# Patient Record
Sex: Female | Born: 1950 | Race: White | Hispanic: No | Marital: Married | Smoking: Never smoker
Health system: Southern US, Community
[De-identification: ages and names within clinical notes are randomized; demographics above are authoritative.]

## PROBLEM LIST (undated history)

## (undated) DIAGNOSIS — I10 Essential (primary) hypertension: Secondary | ICD-10-CM

## (undated) DIAGNOSIS — F419 Anxiety disorder, unspecified: Secondary | ICD-10-CM

## (undated) HISTORY — PX: ABDOMINAL HYSTERECTOMY: SHX81

---

## 2001-05-29 ENCOUNTER — Ambulatory Visit (HOSPITAL_COMMUNITY): Admission: RE | Admit: 2001-05-29 | Discharge: 2001-05-29 | Payer: Self-pay | Admitting: Obstetrics and Gynecology

## 2001-05-29 ENCOUNTER — Encounter: Payer: Self-pay | Admitting: Obstetrics and Gynecology

## 2002-06-11 ENCOUNTER — Encounter: Payer: Self-pay | Admitting: Obstetrics and Gynecology

## 2002-06-11 ENCOUNTER — Ambulatory Visit (HOSPITAL_COMMUNITY): Admission: RE | Admit: 2002-06-11 | Discharge: 2002-06-11 | Payer: Self-pay | Admitting: Obstetrics and Gynecology

## 2002-06-15 ENCOUNTER — Encounter: Payer: Self-pay | Admitting: Obstetrics and Gynecology

## 2002-06-15 ENCOUNTER — Ambulatory Visit (HOSPITAL_COMMUNITY): Admission: RE | Admit: 2002-06-15 | Discharge: 2002-06-15 | Payer: Self-pay | Admitting: Obstetrics and Gynecology

## 2003-06-22 ENCOUNTER — Ambulatory Visit (HOSPITAL_COMMUNITY): Admission: RE | Admit: 2003-06-22 | Discharge: 2003-06-22 | Payer: Self-pay | Admitting: Obstetrics and Gynecology

## 2003-06-22 ENCOUNTER — Encounter: Payer: Self-pay | Admitting: Obstetrics and Gynecology

## 2003-06-28 ENCOUNTER — Ambulatory Visit (HOSPITAL_COMMUNITY): Admission: RE | Admit: 2003-06-28 | Discharge: 2003-06-28 | Payer: Self-pay | Admitting: Obstetrics and Gynecology

## 2003-06-28 ENCOUNTER — Encounter: Payer: Self-pay | Admitting: Obstetrics and Gynecology

## 2004-06-26 ENCOUNTER — Ambulatory Visit (HOSPITAL_COMMUNITY): Admission: RE | Admit: 2004-06-26 | Discharge: 2004-06-26 | Payer: Self-pay | Admitting: Obstetrics and Gynecology

## 2005-07-03 ENCOUNTER — Ambulatory Visit (HOSPITAL_COMMUNITY): Admission: RE | Admit: 2005-07-03 | Discharge: 2005-07-03 | Payer: Self-pay | Admitting: Obstetrics and Gynecology

## 2006-07-11 ENCOUNTER — Ambulatory Visit (HOSPITAL_COMMUNITY): Admission: RE | Admit: 2006-07-11 | Discharge: 2006-07-11 | Payer: Self-pay | Admitting: Internal Medicine

## 2007-07-17 ENCOUNTER — Ambulatory Visit (HOSPITAL_COMMUNITY): Admission: RE | Admit: 2007-07-17 | Discharge: 2007-07-17 | Payer: Self-pay | Admitting: Internal Medicine

## 2008-07-21 ENCOUNTER — Ambulatory Visit (HOSPITAL_COMMUNITY): Admission: RE | Admit: 2008-07-21 | Discharge: 2008-07-21 | Payer: Self-pay | Admitting: Internal Medicine

## 2008-10-31 ENCOUNTER — Emergency Department (HOSPITAL_COMMUNITY): Admission: EM | Admit: 2008-10-31 | Discharge: 2008-10-31 | Payer: Self-pay | Admitting: Family Medicine

## 2009-09-23 ENCOUNTER — Ambulatory Visit (HOSPITAL_COMMUNITY): Admission: RE | Admit: 2009-09-23 | Discharge: 2009-09-23 | Payer: Self-pay | Admitting: Internal Medicine

## 2010-05-22 ENCOUNTER — Ambulatory Visit: Payer: Self-pay | Admitting: Orthopedic Surgery

## 2010-05-22 DIAGNOSIS — Z8679 Personal history of other diseases of the circulatory system: Secondary | ICD-10-CM | POA: Insufficient documentation

## 2010-05-22 DIAGNOSIS — M25559 Pain in unspecified hip: Secondary | ICD-10-CM | POA: Insufficient documentation

## 2010-05-22 DIAGNOSIS — M549 Dorsalgia, unspecified: Secondary | ICD-10-CM | POA: Insufficient documentation

## 2010-07-10 ENCOUNTER — Ambulatory Visit: Payer: Self-pay | Admitting: Orthopedic Surgery

## 2010-07-10 DIAGNOSIS — G5600 Carpal tunnel syndrome, unspecified upper limb: Secondary | ICD-10-CM | POA: Insufficient documentation

## 2010-07-14 ENCOUNTER — Encounter: Payer: Self-pay | Admitting: Orthopedic Surgery

## 2010-08-07 ENCOUNTER — Ambulatory Visit: Payer: Self-pay | Admitting: Orthopedic Surgery

## 2010-08-14 ENCOUNTER — Telehealth (INDEPENDENT_AMBULATORY_CARE_PROVIDER_SITE_OTHER): Payer: Self-pay | Admitting: *Deleted

## 2010-08-14 ENCOUNTER — Telehealth: Payer: Self-pay | Admitting: Orthopedic Surgery

## 2010-08-21 ENCOUNTER — Telehealth: Payer: Self-pay | Admitting: Orthopedic Surgery

## 2010-11-07 NOTE — Assessment & Plan Note (Signed)
Summary: 4 WK RE-CHECK RT HAND/UMR/CAF   Visit Type:  Follow-up Referring :  self Primary :  Dr. Dwana Melena  CC:  right hand.  History of Present Illness: I saw Ellen Martinez in the office today for a 4 week  followup visit.  She is a 60 years old woman with the complaint of:  right hand  Meds: Estazalom, Diovan, Neurontin.  She states that her hand is better. She says that her back and hip is hurting again. She is not taking the Diclofenac since her last visit. It seemed to make her hand worse.  She has also changed jobs.  Rec trying a different Nsaid and continuing neurontin     Allergies: No Known Drug Allergies   Impression & Recommendations:  Problem # 1:  CARPAL TUNNEL SYNDROME (ICD-354.0) Assessment Improved  Orders: Est. Patient Level II (16109)  Problem # 2:  BACK PAIN (ICD-724.5) Assessment: Unchanged  Her updated medication list for this problem includes:    Diclofenac Sodium 75 Mg Tbec (Diclofenac sodium) .Marland Kitchen... 1 two times a day    Etodolac 300 Mg Caps (Etodolac) .Marland Kitchen... 1 by mouth two times a day  Orders: Est. Patient Level II (60454)  Medications Added to Medication List This Visit: 1)  Etodolac 300 Mg Caps (Etodolac) .Marland Kitchen.. 1 by mouth two times a day  Patient Instructions: 1)  Start new medication antiinflammatory Etodolac 2)  if this does not seem to help; callus and we'll try to change the medication by phone  Prescriptions: ETODOLAC 300 MG CAPS (ETODOLAC) 1 by mouth two times a day  #60 x 2   Entered and Authorized by:   Fuller Canada MD   Signed by:   Fuller Canada MD on 08/07/2010   Method used:   Print then Give to Patient   RxID:   [id number]    Orders Added: 1)  Est. Patient Level II [30865]

## 2010-11-07 NOTE — Assessment & Plan Note (Signed)
Summary: LEFT LEG PAIN NEEDS XR/UMR/BSF   Visit Type:  new patient  CC:  left leg pain.  History of Present Illness: I saw Ellen Martinez in the office today for an initial visit.  She is a 60 years old woman with the complaint of:  left leg pain x  3 mos  Xrays today.  Medications: Diovan, Estazalom. She reports no history of trauma but pain in her LEFT buttock region and groin and soreness of the LEFT leg and hip for about 3 months which is currently being relieved by diclofenac 75 mg.  The pain is described as dull intermittent and coming on suddenly.  His relieved by rest it is worse when she walks and while at work.  The pain intensity is 8/10.  She denies numbness tingling locking catching or swelling   Allergies (verified): No Known Drug Allergies  Past History:  Past Medical History: Anxiety Depression High blood pressure  Past Surgical History: Total Abdominal Hysterectomy  Review of Systems Constitutional:  Denies weight loss, weight gain, fever, chills, and fatigue. Cardiovascular:  Denies chest pain, palpitations, fainting, and murmurs. Respiratory:  Denies short of breath, wheezing, couch, tightness, pain on inspiration, and snoring . Gastrointestinal:  Denies heartburn, nausea, vomiting, diarrhea, constipation, and blood in your stools. Genitourinary:  Denies frequency, urgency, difficulty urinating, painful urination, flank pain, and bleeding in urine. Neurologic:  Denies numbness, tingling, unsteady gait, dizziness, tremors, and seizure. Musculoskeletal:  Complains of muscle pain; denies joint pain, swelling, instability, stiffness, redness, and heat. Endocrine:  Denies excessive thirst, exessive urination, and heat or cold intolerance. Psychiatric:  Complains of depression and anxiety; denies nervousness and hallucinations. Skin:  Denies changes in the skin, poor healing, rash, itching, and redness. HEENT:  Denies blurred or double vision, eye pain, redness,  and watering. Immunology:  Complains of seasonal allergies; denies sinus problems and allergic to bee stings. Hemoatologic:  Denies easy bleeding and brusing.  Physical Exam  Additional Exam:  GEN: well developed, well nourished, normal grooming and hygiene, no deformity and normal body habitus.   CDV: pulses are normal, no edema, no erythema. no tenderness  Lymph: normal lymph nodes   Skin: no rashes, skin lesions or open sores   NEURO: normal coordination, reflexes, sensation.   Psyche: awake, alert and oriented. Mood normal   Gait: normal   Lower Extremities leg lengths are equal.  No tenderness over the greater trochanter.  Range of motion is normal pain-free internal and external rotation as well as abduction and adduction.  Motor exam is normal.  Stability of the hip knees and ankles are normal.  No tenderness noted on examination     Impression & Recommendations:  Problem # 1:  BACK PAIN (ICD-724.5) Assessment New  Her updated medication list for this problem includes:    Diclofenac Sodium 75 Mg Tbec (Diclofenac sodium) .Marland Kitchen... 1 two times a day  Orders: New Patient Level III (16109) Lumbosacral Spine ,2/3 views (72100) anterior osteophytes are seen starting in one and 2 and progressing down the spine.  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 1 two times a day  Orders: New Patient Level III (60454) pelvic x-ray  Both hips are seen on the pelvis there is mild lateral tray space narrowing on the LEFT side not seen on the RIGHT suggesting hip arthritis.  Medications Added to Medication List This Visit: 1)  Diclofenac Sodium 75 Mg Tbec (Diclofenac sodium) .Marland Kitchen.. 1 two times a day  Other Orders: Hip x-ray unilateral complete, minimum 2 views (73510) Pelvis x-ray, 1/2 views  (24401)  Patient Instructions: 1)  1. back artritis and  2)  2. hip arthritis  3)  Continue diclofenac for 6 weeks  4)  Please schedule a follow-up appointment as needed. Prescriptions: DICLOFENAC SODIUM 75 MG TBEC (DICLOFENAC SODIUM) 1 two times a day  #60 x 1   Entered and Authorized by:   Fuller Canada MD   Signed by:   Fuller Canada MD on 05/22/2010   Method used:   Print then Give to Patient   RxID:   [id number]

## 2010-11-07 NOTE — Progress Notes (Signed)
Summary: patient states LT hip is still hurting  Phone Note Call from Patient   Caller: Patient Summary of Call: Patient called to relay, as advised at 08/07/10 appointment, that her LT hip is still hurting. States she started the medication Difloucenac 6 days ago, and she has not had much relief.  Asked if any other recommendation, "even an MRI"? Mentioned she is leaving for a trip 08/25/10.  Please advise.  (Her pharmacy is [facility] if any further medication.) Initial call taken by: Cammie Sickle,  August 14, 2010 2:07 PM  Follow-up for Phone Call        call her in a dose pack   prednisone 5mg   as directed  12 day pack x 1 Follow-up by: Fuller Canada MD,  August 14, 2010 4:39 PM

## 2010-11-07 NOTE — Progress Notes (Signed)
Summary: Rx question for hip pain  Phone Note Call from Patient   Caller: Patient Summary of Call: Patient called back to relay that she started the Prednisone X5 days ago as prescribed her LT hip, and has also tried with an Advil.  States still not noticing much difference.  Concerned as she is going away on Fri 08/25/10 for 9 days.  Asking if it would be advisable to try the Dicloflenac 75 mg, which she had been prescribed at initial visit for leg,hip.  Or come in? Please advise.  (Alternate Ph # [phone])  Initial call taken by: Cammie Sickle,  August 21, 2010 3:15 PM  Follow-up for Phone Call        okay to start diclofenac again  Follow-up by: Fuller Canada MD,  August 21, 2010 4:21 PM

## 2010-11-07 NOTE — Progress Notes (Signed)
Summary: Rx phoned in   Phone Note Outgoing Call   Summary of Call: I phoned in Rx per Dr Romeo Apple 08/14/10, to [facility] @ Ph [phone].  Patient has her prescriptions sent via courier to [facility]. Initial call taken by: Cammie Sickle,  August 14, 2010 6:29 PM  Follow-up for Phone Call        Followed up w/patient and w/[facility]. They are processing Rx and sending out on Wed (08/15/10) to [facility].  Patient notified. Follow-up by: Cammie Sickle,  August 15, 2010 11:51 AM

## 2010-11-07 NOTE — Letter (Signed)
Summary: History form  History form   Imported By: Jacklynn Ganong 07/14/2010 10:09:07  _____________________________________________________________________  External Attachment:    Type:   Image     Comment:   External Document

## 2010-11-07 NOTE — Assessment & Plan Note (Signed)
Summary: rt hand numbness/umr/bsf   Vital Signs:  Patient profile:   60 year old female Height:      62 inches Weight:      171 pounds Pulse rate:   60 / minute Resp:     16 per minute  Vitals Entered By: Fuller Canada MD (July 10, 2010 12:16 PM)  Visit Type:  new problem Referring Provider:  self Primary Provider:  Dr. Dwana Melena  CC:  hand pain.  History of Present Illness: I saw Ellen Martinez in the office today for a  visit.  She is a 60 years old woman with the complaint of:  pain in the right hand with burning.  No injury.  Meds: Estazalom, Diovan.  This patient complains of sharp burning intermittent severe pain in her RIGHT hand worse at night and worse with rest.  She did try some diclofenac but the symptoms seemed to get worse.  She was taking Naprosyn back pain issues she was having.  She's had symptoms for 4 months no injury.  She is already wearing a brace at night for the last 3 months without relief.  She has numbness and tingling in the median nerve distribution in her grip strength has weakened.    Allergies (verified): No Known Drug Allergies  Past History:  Past Medical History: Last updated: 05/22/2010 Anxiety Depression High blood pressure  Past Surgical History: Last updated: 05/22/2010 Total Abdominal Hysterectomy  Family History: Last updated: 07/10/2010 FH of Cancer:  Family History of Diabetes  Social History: Last updated: 07/10/2010 Patient is divorced.  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  Review of Systems Neurologic:  Complains of numbness and tingling; denies unsteady gait, dizziness, tremors, and seizure. Psychiatric:  Complains of depression and anxiety; denies nervousness and  hallucinations. Skin:  Complains of itching; denies changes in the skin, poor healing, rash, and redness. Hemoatologic:  Complains of brusing; denies easy bleeding.  The review of systems is negative for Constitutional, Cardiovascular, Respiratory, Gastrointestinal, Genitourinary, Musculoskeletal, Endocrine, HEENT, and Immunology.  Physical Exam  Additional Exam:  GEN: well developed, well nourished, normal grooming and hygiene, no deformity and normal body habitus.   CDV: pulses are normal, no edema, no erythema. no tenderness  Lymph: normal lymph nodes   Skin: no rashes, skin lesions or open sores   NEURO: normal coordination, reflexes, sensation.   Psyche: awake, alert and oriented. Mood normal    ambulation is noncontributory but normal  Her RIGHT hand looks normal she has some tenderness over the carpal tunnel a positive compression test and a positive Phalen's test.  The compression test was positive after about a minute and the Phalen's test was positive after about 40 seconds.  There is no swelling of the hand the range of motion is full her grip strength appears equal to her opposite side the wrist joint is stable  Tinel's sign is negative over the carpal tunnel there   Impression & Recommendations:  Problem # 1:  CARPAL TUNNEL SYNDROME (ICD-354.0) Assessment New  Orders: Est. Patient Level IV (16109)  Medications Added to Medication List This Visit: 1)  Neurontin 100 Mg Caps (Gabapentin) .Marland Kitchen.. 1 by mouth 1st night 2 by mouth 2nd night  3 by mouth 3rd  night on  Patient Instructions: 1)  Use brace every night for pain 2)  Start Over the counter B6 100mg  two times a day 3)  Start Neurontin, take one tablet 1st night before sleep, 2nd night 2 tablets before sleep, 3rd night 3 tablets before sleep and 3 tablets every night if tolerated well if not back off to 2 tablets at night 4)  Come back in 4 weeks Prescriptions: NEURONTIN 100 MG CAPS (GABAPENTIN) 1 by mouth 1st  night 2 by mouth 2nd night  3 by mouth 3rd night on  #90 x 1   Entered and Authorized by:   Fuller Canada MD   Signed by:   Fuller Canada MD on 07/10/2010   Method used:   Print then Give to Patient   RxID:   [id number]

## 2010-11-07 NOTE — Letter (Signed)
Summary: History form  History form   Imported By: Jacklynn Ganong 05/23/2010 15:37:24  _____________________________________________________________________  External Attachment:    Type:   Image     Comment:   External Document

## 2011-03-27 ENCOUNTER — Other Ambulatory Visit (HOSPITAL_COMMUNITY): Payer: Self-pay | Admitting: Internal Medicine

## 2011-03-27 DIAGNOSIS — Z139 Encounter for screening, unspecified: Secondary | ICD-10-CM

## 2011-04-02 ENCOUNTER — Ambulatory Visit (HOSPITAL_COMMUNITY)
Admission: RE | Admit: 2011-04-02 | Discharge: 2011-04-02 | Disposition: A | Discharge status: Home / Self Care | Payer: 59 | Source: Ambulatory Visit | Attending: Internal Medicine | Admitting: Internal Medicine

## 2011-04-02 DIAGNOSIS — Z139 Encounter for screening, unspecified: Secondary | ICD-10-CM

## 2011-04-02 DIAGNOSIS — Z1231 Encounter for screening mammogram for malignant neoplasm of breast: Secondary | ICD-10-CM | POA: Insufficient documentation

## 2011-05-07 ENCOUNTER — Other Ambulatory Visit: Payer: Self-pay | Admitting: Orthopedic Surgery

## 2011-05-07 NOTE — Telephone Encounter (Signed)
Refill prescription

## 2012-04-22 ENCOUNTER — Other Ambulatory Visit: Payer: Self-pay | Admitting: Orthopedic Surgery

## 2013-01-01 ENCOUNTER — Other Ambulatory Visit (HOSPITAL_COMMUNITY): Payer: Self-pay | Admitting: Internal Medicine

## 2013-01-01 ENCOUNTER — Ambulatory Visit (HOSPITAL_COMMUNITY)
Admission: RE | Admit: 2013-01-01 | Discharge: 2013-01-01 | Disposition: A | Discharge status: Home / Self Care | Payer: 59 | Source: Ambulatory Visit | Attending: Internal Medicine | Admitting: Internal Medicine

## 2013-01-01 DIAGNOSIS — Z139 Encounter for screening, unspecified: Secondary | ICD-10-CM

## 2013-01-01 DIAGNOSIS — Z1231 Encounter for screening mammogram for malignant neoplasm of breast: Secondary | ICD-10-CM | POA: Insufficient documentation

## 2013-01-21 ENCOUNTER — Telehealth: Payer: Self-pay | Admitting: *Deleted

## 2013-01-21 NOTE — Telephone Encounter (Signed)
Refill request: Diclofenac Sod 75 mg. [facility]

## 2013-01-23 ENCOUNTER — Other Ambulatory Visit: Payer: Self-pay | Admitting: Orthopedic Surgery

## 2013-01-23 MED ORDER — DICLOFENAC SODIUM 75 MG PO TBEC: 75.0000 mg | DELAYED_RELEASE_TABLET | Freq: Two times a day (BID) | ORAL | Status: DC

## 2013-01-23 NOTE — Telephone Encounter (Signed)
x

## 2015-11-01 DIAGNOSIS — L570 Actinic keratosis: Secondary | ICD-10-CM | POA: Diagnosis not present

## 2015-11-01 DIAGNOSIS — X32XXXA Exposure to sunlight, initial encounter: Secondary | ICD-10-CM | POA: Diagnosis not present

## 2015-11-01 DIAGNOSIS — B009 Herpesviral infection, unspecified: Secondary | ICD-10-CM | POA: Diagnosis not present

## 2015-11-21 DIAGNOSIS — H43813 Vitreous degeneration, bilateral: Secondary | ICD-10-CM | POA: Diagnosis not present

## 2015-11-29 DIAGNOSIS — Z23 Encounter for immunization: Secondary | ICD-10-CM | POA: Diagnosis not present

## 2015-11-29 DIAGNOSIS — J209 Acute bronchitis, unspecified: Secondary | ICD-10-CM | POA: Diagnosis not present

## 2016-04-16 DIAGNOSIS — X32XXXD Exposure to sunlight, subsequent encounter: Secondary | ICD-10-CM | POA: Diagnosis not present

## 2016-04-16 DIAGNOSIS — L570 Actinic keratosis: Secondary | ICD-10-CM | POA: Diagnosis not present

## 2016-04-17 DIAGNOSIS — E782 Mixed hyperlipidemia: Secondary | ICD-10-CM | POA: Diagnosis not present

## 2016-04-17 DIAGNOSIS — R7301 Impaired fasting glucose: Secondary | ICD-10-CM | POA: Diagnosis not present

## 2016-04-17 DIAGNOSIS — I10 Essential (primary) hypertension: Secondary | ICD-10-CM | POA: Diagnosis not present

## 2016-04-23 DIAGNOSIS — L570 Actinic keratosis: Secondary | ICD-10-CM | POA: Diagnosis not present

## 2016-04-23 DIAGNOSIS — E782 Mixed hyperlipidemia: Secondary | ICD-10-CM | POA: Diagnosis not present

## 2016-04-23 DIAGNOSIS — F5101 Primary insomnia: Secondary | ICD-10-CM | POA: Diagnosis not present

## 2016-04-23 DIAGNOSIS — R7301 Impaired fasting glucose: Secondary | ICD-10-CM | POA: Diagnosis not present

## 2016-04-23 DIAGNOSIS — I10 Essential (primary) hypertension: Secondary | ICD-10-CM | POA: Diagnosis not present

## 2016-05-29 DIAGNOSIS — X32XXXD Exposure to sunlight, subsequent encounter: Secondary | ICD-10-CM | POA: Diagnosis not present

## 2016-05-29 DIAGNOSIS — L281 Prurigo nodularis: Secondary | ICD-10-CM | POA: Diagnosis not present

## 2016-05-29 DIAGNOSIS — L570 Actinic keratosis: Secondary | ICD-10-CM | POA: Diagnosis not present

## 2017-03-20 DIAGNOSIS — X32XXXD Exposure to sunlight, subsequent encounter: Secondary | ICD-10-CM | POA: Diagnosis not present

## 2017-03-20 DIAGNOSIS — L570 Actinic keratosis: Secondary | ICD-10-CM | POA: Diagnosis not present

## 2017-03-20 DIAGNOSIS — L820 Inflamed seborrheic keratosis: Secondary | ICD-10-CM | POA: Diagnosis not present

## 2017-05-06 ENCOUNTER — Other Ambulatory Visit (HOSPITAL_COMMUNITY): Payer: Self-pay | Admitting: Internal Medicine

## 2017-05-06 DIAGNOSIS — Z1231 Encounter for screening mammogram for malignant neoplasm of breast: Secondary | ICD-10-CM

## 2017-05-08 ENCOUNTER — Ambulatory Visit (HOSPITAL_COMMUNITY)
Admission: RE | Admit: 2017-05-08 | Discharge: 2017-05-08 | Disposition: A | Discharge status: Home / Self Care | Payer: Medicare Other | Source: Ambulatory Visit | Attending: Internal Medicine | Admitting: Internal Medicine

## 2017-05-08 DIAGNOSIS — Z1231 Encounter for screening mammogram for malignant neoplasm of breast: Secondary | ICD-10-CM | POA: Insufficient documentation

## 2017-06-04 DIAGNOSIS — I10 Essential (primary) hypertension: Secondary | ICD-10-CM | POA: Diagnosis not present

## 2017-06-04 DIAGNOSIS — R7301 Impaired fasting glucose: Secondary | ICD-10-CM | POA: Diagnosis not present

## 2017-06-06 DIAGNOSIS — R7301 Impaired fasting glucose: Secondary | ICD-10-CM | POA: Diagnosis not present

## 2017-06-06 DIAGNOSIS — Z6831 Body mass index (BMI) 31.0-31.9, adult: Secondary | ICD-10-CM | POA: Diagnosis not present

## 2017-06-06 DIAGNOSIS — I10 Essential (primary) hypertension: Secondary | ICD-10-CM | POA: Diagnosis not present

## 2017-06-06 DIAGNOSIS — F5101 Primary insomnia: Secondary | ICD-10-CM | POA: Diagnosis not present

## 2017-06-06 DIAGNOSIS — E782 Mixed hyperlipidemia: Secondary | ICD-10-CM | POA: Diagnosis not present

## 2017-06-06 DIAGNOSIS — L570 Actinic keratosis: Secondary | ICD-10-CM | POA: Diagnosis not present

## 2017-07-16 DIAGNOSIS — X32XXXD Exposure to sunlight, subsequent encounter: Secondary | ICD-10-CM | POA: Diagnosis not present

## 2017-07-16 DIAGNOSIS — L570 Actinic keratosis: Secondary | ICD-10-CM | POA: Diagnosis not present

## 2017-12-10 DIAGNOSIS — X32XXXD Exposure to sunlight, subsequent encounter: Secondary | ICD-10-CM | POA: Diagnosis not present

## 2017-12-10 DIAGNOSIS — C44311 Basal cell carcinoma of skin of nose: Secondary | ICD-10-CM | POA: Diagnosis not present

## 2017-12-10 DIAGNOSIS — L570 Actinic keratosis: Secondary | ICD-10-CM | POA: Diagnosis not present

## 2018-01-20 DIAGNOSIS — I10 Essential (primary) hypertension: Secondary | ICD-10-CM | POA: Diagnosis not present

## 2018-01-20 DIAGNOSIS — F5101 Primary insomnia: Secondary | ICD-10-CM | POA: Diagnosis not present

## 2018-01-20 DIAGNOSIS — Z6829 Body mass index (BMI) 29.0-29.9, adult: Secondary | ICD-10-CM | POA: Diagnosis not present

## 2018-02-13 DIAGNOSIS — I10 Essential (primary) hypertension: Secondary | ICD-10-CM | POA: Diagnosis not present

## 2018-02-13 DIAGNOSIS — M79672 Pain in left foot: Secondary | ICD-10-CM | POA: Diagnosis not present

## 2018-02-13 DIAGNOSIS — F5101 Primary insomnia: Secondary | ICD-10-CM | POA: Diagnosis not present

## 2018-02-13 DIAGNOSIS — Z6829 Body mass index (BMI) 29.0-29.9, adult: Secondary | ICD-10-CM | POA: Diagnosis not present

## 2018-05-06 ENCOUNTER — Other Ambulatory Visit (HOSPITAL_COMMUNITY): Payer: Self-pay | Admitting: Internal Medicine

## 2018-05-06 DIAGNOSIS — Z1231 Encounter for screening mammogram for malignant neoplasm of breast: Secondary | ICD-10-CM

## 2018-05-14 ENCOUNTER — Ambulatory Visit (HOSPITAL_COMMUNITY)
Admission: RE | Admit: 2018-05-14 | Discharge: 2018-05-14 | Disposition: A | Discharge status: Home / Self Care | Payer: Medicare Other | Source: Ambulatory Visit | Attending: Internal Medicine | Admitting: Internal Medicine

## 2018-05-14 DIAGNOSIS — Z1231 Encounter for screening mammogram for malignant neoplasm of breast: Secondary | ICD-10-CM | POA: Insufficient documentation

## 2018-05-22 DIAGNOSIS — I10 Essential (primary) hypertension: Secondary | ICD-10-CM | POA: Diagnosis not present

## 2018-05-22 DIAGNOSIS — R7301 Impaired fasting glucose: Secondary | ICD-10-CM | POA: Diagnosis not present

## 2018-05-26 DIAGNOSIS — Z0000 Encounter for general adult medical examination without abnormal findings: Secondary | ICD-10-CM | POA: Diagnosis not present

## 2018-05-26 DIAGNOSIS — E782 Mixed hyperlipidemia: Secondary | ICD-10-CM | POA: Diagnosis not present

## 2018-05-26 DIAGNOSIS — G4700 Insomnia, unspecified: Secondary | ICD-10-CM | POA: Diagnosis not present

## 2018-05-26 DIAGNOSIS — I10 Essential (primary) hypertension: Secondary | ICD-10-CM | POA: Diagnosis not present

## 2018-05-26 DIAGNOSIS — H6120 Impacted cerumen, unspecified ear: Secondary | ICD-10-CM | POA: Diagnosis not present

## 2018-05-26 DIAGNOSIS — Z6829 Body mass index (BMI) 29.0-29.9, adult: Secondary | ICD-10-CM | POA: Diagnosis not present

## 2018-05-26 DIAGNOSIS — R7301 Impaired fasting glucose: Secondary | ICD-10-CM | POA: Diagnosis not present

## 2018-10-10 DIAGNOSIS — M7672 Peroneal tendinitis, left leg: Secondary | ICD-10-CM | POA: Diagnosis not present

## 2018-10-10 DIAGNOSIS — M79672 Pain in left foot: Secondary | ICD-10-CM | POA: Diagnosis not present

## 2018-10-10 DIAGNOSIS — M7662 Achilles tendinitis, left leg: Secondary | ICD-10-CM | POA: Diagnosis not present

## 2019-03-01 IMAGING — MG DIGITAL SCREENING BILATERAL MAMMOGRAM WITH TOMO AND CAD
5 series · 6 of 13 positions shown · non-contrast
Comparison: Previous exam(s).

CLINICAL DATA: Screening.

EXAM:
DIGITAL SCREENING BILATERAL MAMMOGRAM WITH TOMO AND CAD

[R CC synth-2D]
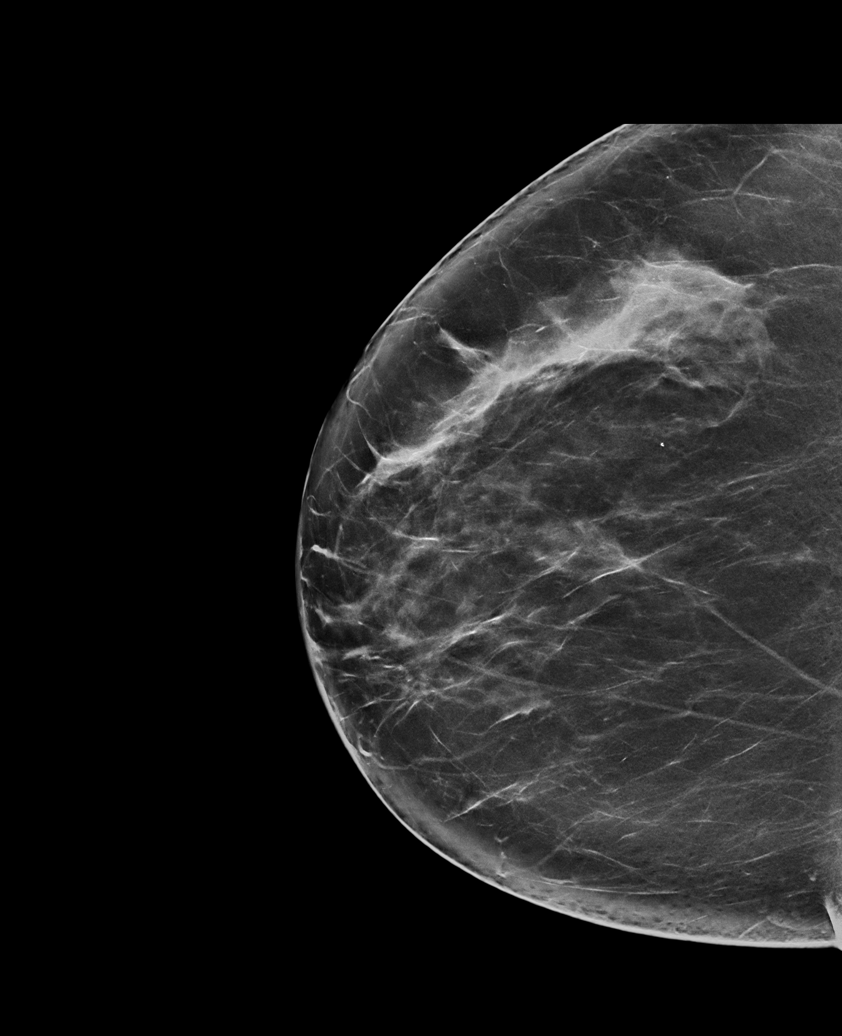

[L MLO synth-2D]
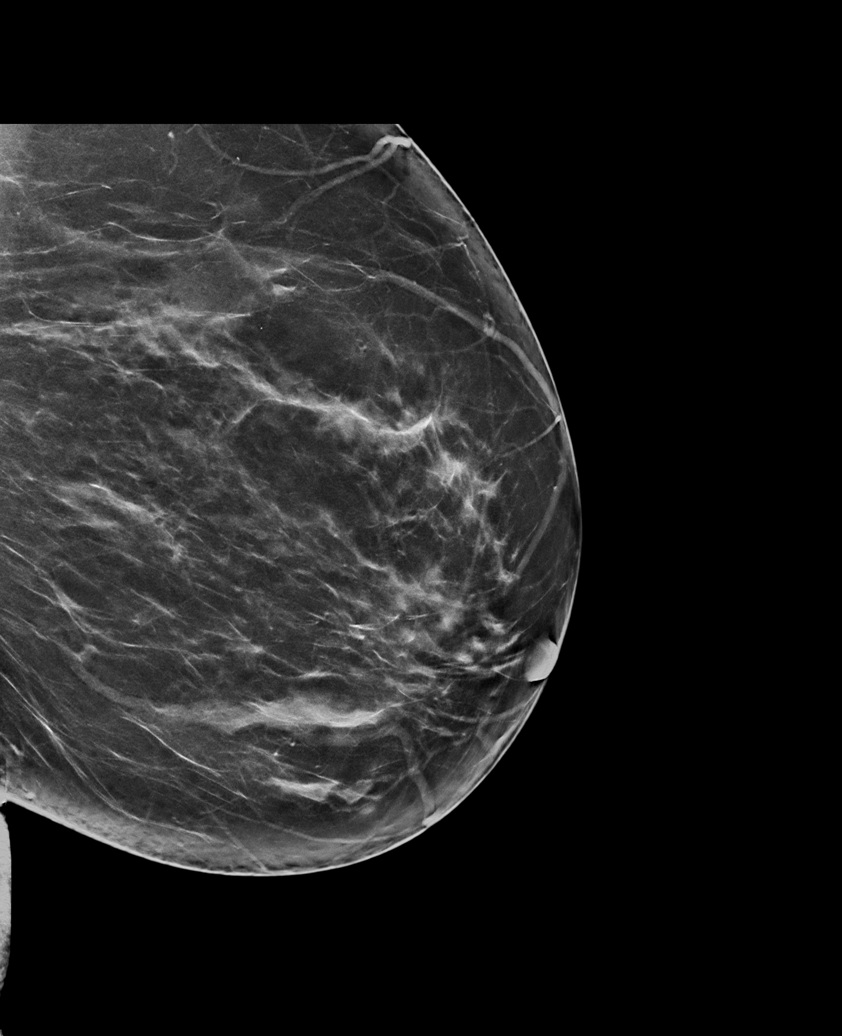

[L CC synth-2D]
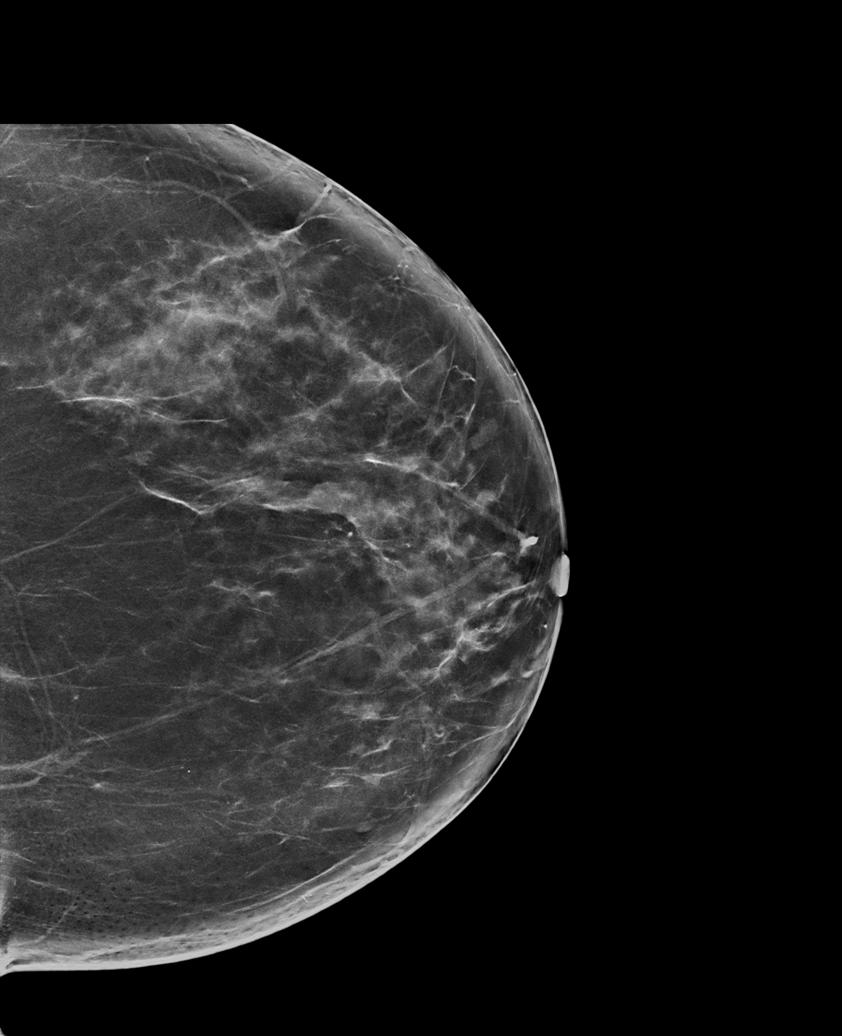

[R CC tomo · 2 of 85 frames shown]
[frame 28/85]
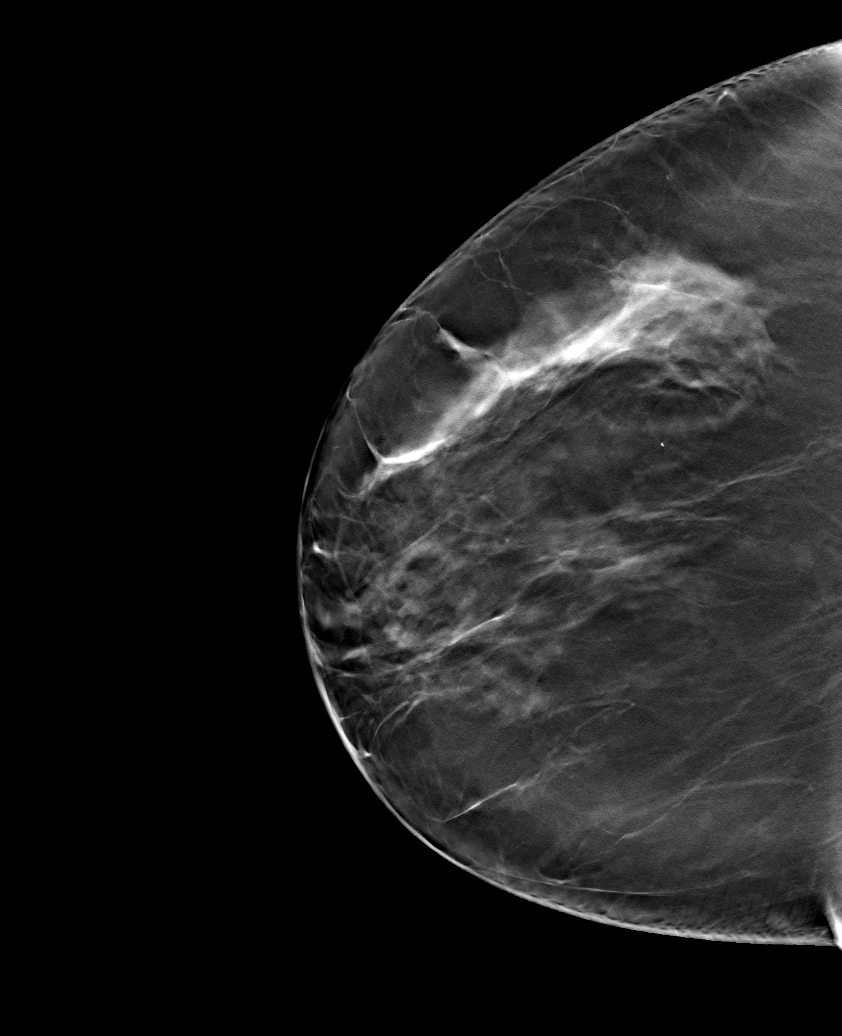
[frame 43/85]
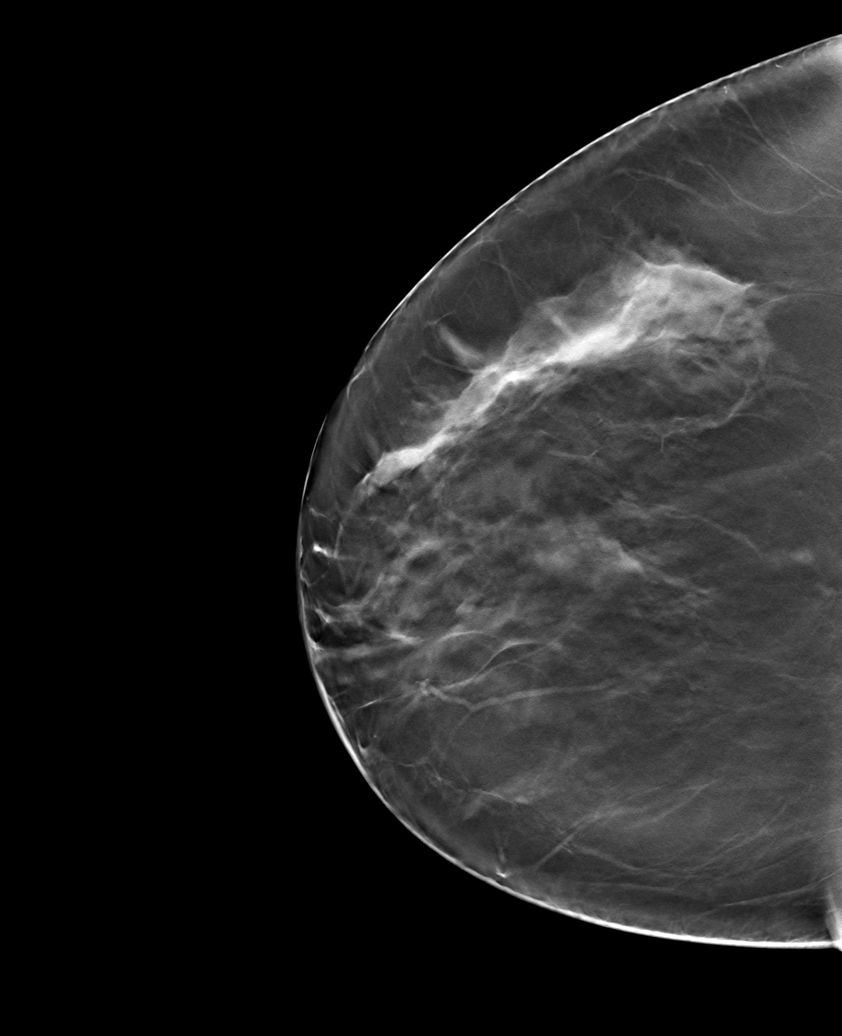

[L MLO tomo · tomo slice 43/84.0]
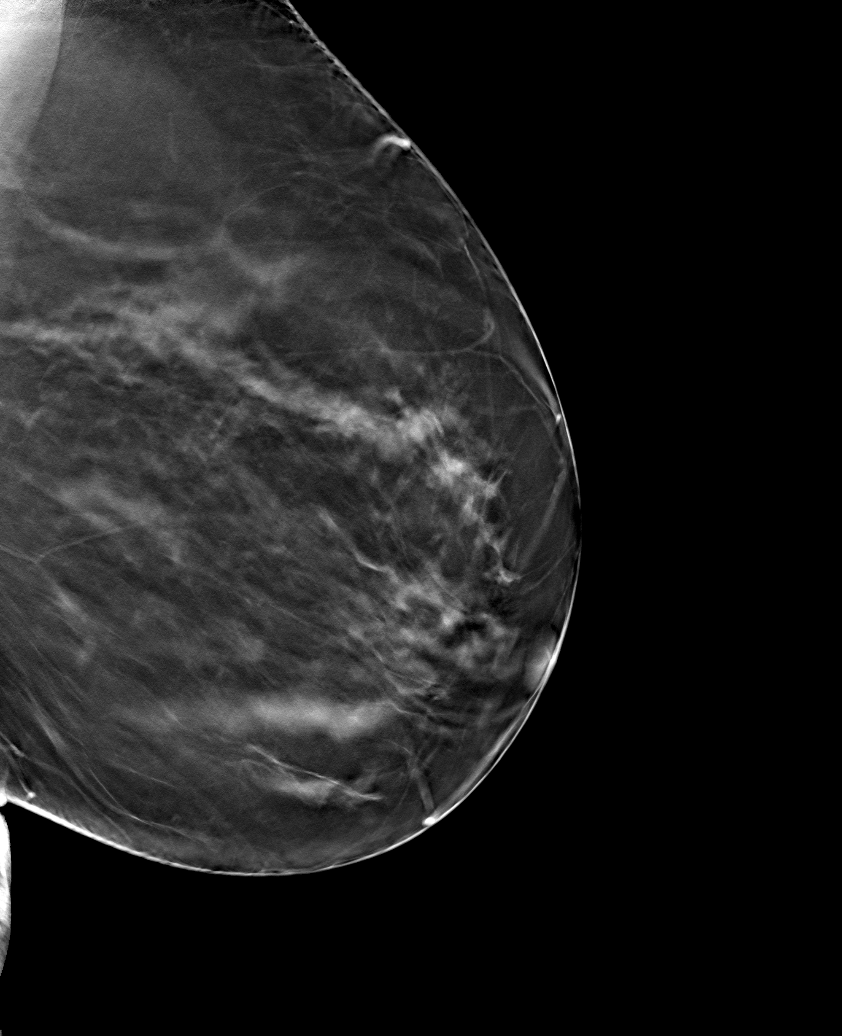

[6 of 13 positions shown; findings below may reference images not displayed]

ACR Breast Density Category b: There are scattered areas of
fibroglandular density.
FINDINGS: There are no findings suspicious for malignancy. Images were
processed with CAD.
IMPRESSION: No mammographic evidence of malignancy. A result letter of this
screening mammogram will be mailed directly to the patient.

RECOMMENDATION:
Screening mammogram in one year. (Code:[id number])

BI-RADS CATEGORY  1: Negative.

## 2019-03-18 DIAGNOSIS — L0202 Furuncle of face: Secondary | ICD-10-CM | POA: Diagnosis not present

## 2019-03-18 DIAGNOSIS — C44311 Basal cell carcinoma of skin of nose: Secondary | ICD-10-CM | POA: Diagnosis not present

## 2019-03-18 DIAGNOSIS — B9689 Other specified bacterial agents as the cause of diseases classified elsewhere: Secondary | ICD-10-CM | POA: Diagnosis not present

## 2019-04-27 DIAGNOSIS — Z08 Encounter for follow-up examination after completed treatment for malignant neoplasm: Secondary | ICD-10-CM | POA: Diagnosis not present

## 2019-04-27 DIAGNOSIS — X32XXXD Exposure to sunlight, subsequent encounter: Secondary | ICD-10-CM | POA: Diagnosis not present

## 2019-04-27 DIAGNOSIS — L570 Actinic keratosis: Secondary | ICD-10-CM | POA: Diagnosis not present

## 2019-04-27 DIAGNOSIS — Z85828 Personal history of other malignant neoplasm of skin: Secondary | ICD-10-CM | POA: Diagnosis not present

## 2019-04-29 DIAGNOSIS — Z1211 Encounter for screening for malignant neoplasm of colon: Secondary | ICD-10-CM | POA: Diagnosis not present

## 2019-04-29 DIAGNOSIS — Z1212 Encounter for screening for malignant neoplasm of rectum: Secondary | ICD-10-CM | POA: Diagnosis not present

## 2019-05-08 ENCOUNTER — Other Ambulatory Visit: Payer: Self-pay

## 2019-06-22 DIAGNOSIS — Z85828 Personal history of other malignant neoplasm of skin: Secondary | ICD-10-CM | POA: Diagnosis not present

## 2019-06-22 DIAGNOSIS — X32XXXD Exposure to sunlight, subsequent encounter: Secondary | ICD-10-CM | POA: Diagnosis not present

## 2019-06-22 DIAGNOSIS — L570 Actinic keratosis: Secondary | ICD-10-CM | POA: Diagnosis not present

## 2019-06-22 DIAGNOSIS — Z08 Encounter for follow-up examination after completed treatment for malignant neoplasm: Secondary | ICD-10-CM | POA: Diagnosis not present

## 2019-11-03 DIAGNOSIS — Z85828 Personal history of other malignant neoplasm of skin: Secondary | ICD-10-CM | POA: Diagnosis not present

## 2019-11-03 DIAGNOSIS — Z08 Encounter for follow-up examination after completed treatment for malignant neoplasm: Secondary | ICD-10-CM | POA: Diagnosis not present

## 2019-11-03 DIAGNOSIS — X32XXXD Exposure to sunlight, subsequent encounter: Secondary | ICD-10-CM | POA: Diagnosis not present

## 2019-11-03 DIAGNOSIS — L570 Actinic keratosis: Secondary | ICD-10-CM | POA: Diagnosis not present

## 2020-01-29 DIAGNOSIS — Z6829 Body mass index (BMI) 29.0-29.9, adult: Secondary | ICD-10-CM | POA: Diagnosis not present

## 2020-01-29 DIAGNOSIS — R7301 Impaired fasting glucose: Secondary | ICD-10-CM | POA: Diagnosis not present

## 2020-01-29 DIAGNOSIS — I10 Essential (primary) hypertension: Secondary | ICD-10-CM | POA: Diagnosis not present

## 2020-01-29 DIAGNOSIS — F5101 Primary insomnia: Secondary | ICD-10-CM | POA: Diagnosis not present

## 2020-01-29 DIAGNOSIS — E782 Mixed hyperlipidemia: Secondary | ICD-10-CM | POA: Diagnosis not present

## 2020-01-29 DIAGNOSIS — H6120 Impacted cerumen, unspecified ear: Secondary | ICD-10-CM | POA: Diagnosis not present

## 2020-01-29 DIAGNOSIS — G4700 Insomnia, unspecified: Secondary | ICD-10-CM | POA: Diagnosis not present

## 2020-01-29 DIAGNOSIS — Z0000 Encounter for general adult medical examination without abnormal findings: Secondary | ICD-10-CM | POA: Diagnosis not present

## 2020-01-29 DIAGNOSIS — M79672 Pain in left foot: Secondary | ICD-10-CM | POA: Diagnosis not present

## 2020-02-03 DIAGNOSIS — R7303 Prediabetes: Secondary | ICD-10-CM | POA: Diagnosis not present

## 2020-02-03 DIAGNOSIS — Z0001 Encounter for general adult medical examination with abnormal findings: Secondary | ICD-10-CM | POA: Diagnosis not present

## 2020-02-03 DIAGNOSIS — Z6831 Body mass index (BMI) 31.0-31.9, adult: Secondary | ICD-10-CM | POA: Diagnosis not present

## 2020-02-03 DIAGNOSIS — I10 Essential (primary) hypertension: Secondary | ICD-10-CM | POA: Diagnosis not present

## 2020-02-03 DIAGNOSIS — Z6829 Body mass index (BMI) 29.0-29.9, adult: Secondary | ICD-10-CM | POA: Diagnosis not present

## 2020-02-03 DIAGNOSIS — E6609 Other obesity due to excess calories: Secondary | ICD-10-CM | POA: Diagnosis not present

## 2020-02-03 DIAGNOSIS — R7301 Impaired fasting glucose: Secondary | ICD-10-CM | POA: Diagnosis not present

## 2020-02-03 DIAGNOSIS — G4700 Insomnia, unspecified: Secondary | ICD-10-CM | POA: Diagnosis not present

## 2020-02-03 DIAGNOSIS — E782 Mixed hyperlipidemia: Secondary | ICD-10-CM | POA: Diagnosis not present

## 2020-02-10 DIAGNOSIS — X32XXXD Exposure to sunlight, subsequent encounter: Secondary | ICD-10-CM | POA: Diagnosis not present

## 2020-02-10 DIAGNOSIS — C44311 Basal cell carcinoma of skin of nose: Secondary | ICD-10-CM | POA: Diagnosis not present

## 2020-02-10 DIAGNOSIS — L820 Inflamed seborrheic keratosis: Secondary | ICD-10-CM | POA: Diagnosis not present

## 2020-02-10 DIAGNOSIS — L570 Actinic keratosis: Secondary | ICD-10-CM | POA: Diagnosis not present

## 2020-11-29 DIAGNOSIS — C4441 Basal cell carcinoma of skin of scalp and neck: Secondary | ICD-10-CM | POA: Diagnosis not present

## 2020-11-29 DIAGNOSIS — X32XXXD Exposure to sunlight, subsequent encounter: Secondary | ICD-10-CM | POA: Diagnosis not present

## 2020-11-29 DIAGNOSIS — C44219 Basal cell carcinoma of skin of left ear and external auricular canal: Secondary | ICD-10-CM | POA: Diagnosis not present

## 2020-11-29 DIAGNOSIS — L570 Actinic keratosis: Secondary | ICD-10-CM | POA: Diagnosis not present

## 2021-02-02 DIAGNOSIS — E782 Mixed hyperlipidemia: Secondary | ICD-10-CM | POA: Diagnosis not present

## 2021-02-02 DIAGNOSIS — R7303 Prediabetes: Secondary | ICD-10-CM | POA: Diagnosis not present

## 2021-02-02 DIAGNOSIS — H6120 Impacted cerumen, unspecified ear: Secondary | ICD-10-CM | POA: Diagnosis not present

## 2021-02-02 DIAGNOSIS — R7301 Impaired fasting glucose: Secondary | ICD-10-CM | POA: Diagnosis not present

## 2021-02-02 DIAGNOSIS — G4700 Insomnia, unspecified: Secondary | ICD-10-CM | POA: Diagnosis not present

## 2021-02-02 DIAGNOSIS — Z6829 Body mass index (BMI) 29.0-29.9, adult: Secondary | ICD-10-CM | POA: Diagnosis not present

## 2021-02-02 DIAGNOSIS — Z0001 Encounter for general adult medical examination with abnormal findings: Secondary | ICD-10-CM | POA: Diagnosis not present

## 2021-02-02 DIAGNOSIS — F5101 Primary insomnia: Secondary | ICD-10-CM | POA: Diagnosis not present

## 2021-02-02 DIAGNOSIS — Z6831 Body mass index (BMI) 31.0-31.9, adult: Secondary | ICD-10-CM | POA: Diagnosis not present

## 2021-02-02 DIAGNOSIS — E6609 Other obesity due to excess calories: Secondary | ICD-10-CM | POA: Diagnosis not present

## 2021-02-02 DIAGNOSIS — I10 Essential (primary) hypertension: Secondary | ICD-10-CM | POA: Diagnosis not present

## 2021-02-02 DIAGNOSIS — M79672 Pain in left foot: Secondary | ICD-10-CM | POA: Diagnosis not present

## 2021-02-08 DIAGNOSIS — F5104 Psychophysiologic insomnia: Secondary | ICD-10-CM | POA: Diagnosis not present

## 2021-02-08 DIAGNOSIS — E782 Mixed hyperlipidemia: Secondary | ICD-10-CM | POA: Diagnosis not present

## 2021-02-08 DIAGNOSIS — R42 Dizziness and giddiness: Secondary | ICD-10-CM | POA: Diagnosis not present

## 2021-02-08 DIAGNOSIS — I10 Essential (primary) hypertension: Secondary | ICD-10-CM | POA: Diagnosis not present

## 2021-02-08 DIAGNOSIS — R7303 Prediabetes: Secondary | ICD-10-CM | POA: Diagnosis not present

## 2021-02-27 DIAGNOSIS — X32XXXD Exposure to sunlight, subsequent encounter: Secondary | ICD-10-CM | POA: Diagnosis not present

## 2021-02-27 DIAGNOSIS — Z08 Encounter for follow-up examination after completed treatment for malignant neoplasm: Secondary | ICD-10-CM | POA: Diagnosis not present

## 2021-02-27 DIAGNOSIS — Z85828 Personal history of other malignant neoplasm of skin: Secondary | ICD-10-CM | POA: Diagnosis not present

## 2021-02-27 DIAGNOSIS — L570 Actinic keratosis: Secondary | ICD-10-CM | POA: Diagnosis not present

## 2021-05-30 DIAGNOSIS — M7662 Achilles tendinitis, left leg: Secondary | ICD-10-CM | POA: Diagnosis not present

## 2021-08-22 DIAGNOSIS — I10 Essential (primary) hypertension: Secondary | ICD-10-CM | POA: Diagnosis not present

## 2021-08-22 DIAGNOSIS — R7301 Impaired fasting glucose: Secondary | ICD-10-CM | POA: Diagnosis not present

## 2021-08-24 DIAGNOSIS — Z0001 Encounter for general adult medical examination with abnormal findings: Secondary | ICD-10-CM | POA: Diagnosis not present

## 2021-08-24 DIAGNOSIS — R42 Dizziness and giddiness: Secondary | ICD-10-CM | POA: Diagnosis not present

## 2021-08-24 DIAGNOSIS — Z85828 Personal history of other malignant neoplasm of skin: Secondary | ICD-10-CM | POA: Diagnosis not present

## 2021-08-24 DIAGNOSIS — R202 Paresthesia of skin: Secondary | ICD-10-CM | POA: Diagnosis not present

## 2021-08-24 DIAGNOSIS — R7303 Prediabetes: Secondary | ICD-10-CM | POA: Diagnosis not present

## 2021-08-24 DIAGNOSIS — I10 Essential (primary) hypertension: Secondary | ICD-10-CM | POA: Diagnosis not present

## 2021-08-24 DIAGNOSIS — F5104 Psychophysiologic insomnia: Secondary | ICD-10-CM | POA: Diagnosis not present

## 2021-08-24 DIAGNOSIS — E782 Mixed hyperlipidemia: Secondary | ICD-10-CM | POA: Diagnosis not present

## 2021-09-07 DIAGNOSIS — R059 Cough, unspecified: Secondary | ICD-10-CM | POA: Diagnosis not present

## 2021-09-07 DIAGNOSIS — R197 Diarrhea, unspecified: Secondary | ICD-10-CM | POA: Diagnosis not present

## 2021-09-07 DIAGNOSIS — R5383 Other fatigue: Secondary | ICD-10-CM | POA: Diagnosis not present

## 2021-09-07 DIAGNOSIS — R509 Fever, unspecified: Secondary | ICD-10-CM | POA: Diagnosis not present

## 2021-09-07 DIAGNOSIS — I10 Essential (primary) hypertension: Secondary | ICD-10-CM | POA: Diagnosis not present

## 2021-09-07 DIAGNOSIS — R630 Anorexia: Secondary | ICD-10-CM | POA: Diagnosis not present

## 2021-10-03 DIAGNOSIS — C44722 Squamous cell carcinoma of skin of right lower limb, including hip: Secondary | ICD-10-CM | POA: Diagnosis not present

## 2021-10-03 DIAGNOSIS — L570 Actinic keratosis: Secondary | ICD-10-CM | POA: Diagnosis not present

## 2021-10-03 DIAGNOSIS — Z08 Encounter for follow-up examination after completed treatment for malignant neoplasm: Secondary | ICD-10-CM | POA: Diagnosis not present

## 2021-10-03 DIAGNOSIS — Z85828 Personal history of other malignant neoplasm of skin: Secondary | ICD-10-CM | POA: Diagnosis not present

## 2021-10-03 DIAGNOSIS — X32XXXD Exposure to sunlight, subsequent encounter: Secondary | ICD-10-CM | POA: Diagnosis not present

## 2021-10-03 DIAGNOSIS — D0472 Carcinoma in situ of skin of left lower limb, including hip: Secondary | ICD-10-CM | POA: Diagnosis not present

## 2021-12-18 DIAGNOSIS — Z85828 Personal history of other malignant neoplasm of skin: Secondary | ICD-10-CM | POA: Diagnosis not present

## 2021-12-18 DIAGNOSIS — Z08 Encounter for follow-up examination after completed treatment for malignant neoplasm: Secondary | ICD-10-CM | POA: Diagnosis not present

## 2021-12-18 DIAGNOSIS — X32XXXD Exposure to sunlight, subsequent encounter: Secondary | ICD-10-CM | POA: Diagnosis not present

## 2021-12-18 DIAGNOSIS — L570 Actinic keratosis: Secondary | ICD-10-CM | POA: Diagnosis not present

## 2022-02-16 DIAGNOSIS — R7303 Prediabetes: Secondary | ICD-10-CM | POA: Diagnosis not present

## 2022-02-16 DIAGNOSIS — I10 Essential (primary) hypertension: Secondary | ICD-10-CM | POA: Diagnosis not present

## 2022-02-16 DIAGNOSIS — E782 Mixed hyperlipidemia: Secondary | ICD-10-CM | POA: Diagnosis not present

## 2022-02-20 DIAGNOSIS — E782 Mixed hyperlipidemia: Secondary | ICD-10-CM | POA: Diagnosis not present

## 2022-02-20 DIAGNOSIS — R7303 Prediabetes: Secondary | ICD-10-CM | POA: Diagnosis not present

## 2022-02-20 DIAGNOSIS — I10 Essential (primary) hypertension: Secondary | ICD-10-CM | POA: Diagnosis not present

## 2022-02-20 DIAGNOSIS — F5104 Psychophysiologic insomnia: Secondary | ICD-10-CM | POA: Diagnosis not present

## 2022-02-20 DIAGNOSIS — R42 Dizziness and giddiness: Secondary | ICD-10-CM | POA: Diagnosis not present

## 2022-02-20 DIAGNOSIS — Z85828 Personal history of other malignant neoplasm of skin: Secondary | ICD-10-CM | POA: Diagnosis not present

## 2022-03-14 DIAGNOSIS — C44622 Squamous cell carcinoma of skin of right upper limb, including shoulder: Secondary | ICD-10-CM | POA: Diagnosis not present

## 2022-03-14 DIAGNOSIS — L570 Actinic keratosis: Secondary | ICD-10-CM | POA: Diagnosis not present

## 2022-03-14 DIAGNOSIS — D485 Neoplasm of uncertain behavior of skin: Secondary | ICD-10-CM | POA: Diagnosis not present

## 2022-03-14 DIAGNOSIS — X32XXXD Exposure to sunlight, subsequent encounter: Secondary | ICD-10-CM | POA: Diagnosis not present

## 2022-05-21 DIAGNOSIS — Z85828 Personal history of other malignant neoplasm of skin: Secondary | ICD-10-CM | POA: Diagnosis not present

## 2022-05-21 DIAGNOSIS — C44319 Basal cell carcinoma of skin of other parts of face: Secondary | ICD-10-CM | POA: Diagnosis not present

## 2022-05-21 DIAGNOSIS — Z08 Encounter for follow-up examination after completed treatment for malignant neoplasm: Secondary | ICD-10-CM | POA: Diagnosis not present

## 2022-05-21 DIAGNOSIS — C44311 Basal cell carcinoma of skin of nose: Secondary | ICD-10-CM | POA: Diagnosis not present

## 2022-05-21 DIAGNOSIS — D2339 Other benign neoplasm of skin of other parts of face: Secondary | ICD-10-CM | POA: Diagnosis not present

## 2022-07-30 ENCOUNTER — Ambulatory Visit: Payer: Self-pay

## 2022-07-30 ENCOUNTER — Ambulatory Visit (INDEPENDENT_AMBULATORY_CARE_PROVIDER_SITE_OTHER): Payer: PPO

## 2022-07-30 ENCOUNTER — Ambulatory Visit
Admission: EM | Admit: 2022-07-30 | Discharge: 2022-07-30 | Disposition: A | Discharge status: Home / Self Care | Payer: PPO

## 2022-07-30 DIAGNOSIS — M7752 Other enthesopathy of left foot: Secondary | ICD-10-CM | POA: Diagnosis not present

## 2022-07-30 DIAGNOSIS — M25572 Pain in left ankle and joints of left foot: Secondary | ICD-10-CM

## 2022-07-30 DIAGNOSIS — S99912A Unspecified injury of left ankle, initial encounter: Secondary | ICD-10-CM | POA: Diagnosis not present

## 2022-07-30 HISTORY — DX: Essential (primary) hypertension: I10

## 2022-07-30 NOTE — Discharge Instructions (Addendum)
The x-ray today shows chronic bone spur above your left heel which most likely means your achilles tendon has been inflamed for a while.  We have put an ace wrap around your ankle and heel today - please wear this to help with compression and stabilization.    Please follow up with a Podiatrist as soon as possible, I have attached contact information for the foot doctor in [place].  Continue meloxicam for pain; you can alternate with Tylenol [phone] mg every 6 hours for pain.  Do not take meloxicam with ibuprofen, Advil, or other NSAIDs.

## 2022-07-30 NOTE — ED Triage Notes (Signed)
Pt reports on and off in left ankle x 7 years. Reports Meloxciam is not giving relief as is used to.   Pt requested X-rays.

## 2022-07-30 NOTE — ED Provider Notes (Signed)
[facility]    CSN: [id number] Arrival date & time: 07/30/22  0948      History   Chief Complaint Chief Complaint  Patient presents with   Appointment    1000   Foot Pain    HPI Ellen Martinez is a 71 y.o. female.   Patient presents with left ankle pain that has been ongoing on and off for the past 7 years.  Reports she was initially seen by her primary care provider who thought she may have injured her Achilles tendon.  Reports she also saw a podiatrist initially and was told to take meloxicam whenever it flares up.  Reports since Friday, she has been having increase in pain.  No recent injury, accident, fall, or trauma to the ankle or heel.  Reports it is very difficult to bear weight because of the pain.  She has been taking the meloxicam which has not been as helpful as it usually is.  No weakness, swelling, redness, bruising, or numbness or tingling into her toes.  No fevers or nausea/vomiting since the pain acutely worsened.     Past Medical History:  Diagnosis Date   Hypertension     Patient Active Problem List   Diagnosis Date Noted   CARPAL TUNNEL SYNDROME 07/10/2010   HIP PAIN 05/22/2010   BACK PAIN 05/22/2010   HIGH BLOOD PRESSURE 05/22/2010    Past Surgical History:  Procedure Laterality Date   ABDOMINAL HYSTERECTOMY      OB History   No obstetric history on file.      Home Medications    Prior to Admission medications   Medication Sig Start Date End Date Taking? Authorizing Provider  VITAMIN D PO Take by mouth.   Yes [provider]  Zinc Sulfate (ZINC 15 PO) Take by mouth.   Yes [provider]  estazolam (PROSOM) 2 MG tablet Take 2 mg by mouth at bedtime as needed. 07/03/22   [provider]  losartan-hydrochlorothiazide (HYZAAR) 100-25 MG tablet Take 1 tablet by mouth daily. 06/04/22   [provider]  meloxicam (MOBIC) 7.5 MG tablet Take 7.5 mg by mouth daily. 05/25/22   [provider]     Family History History reviewed. No pertinent family history.  Social History Social History   Tobacco Use   Smoking status: Never   Smokeless tobacco: Never  Vaping Use   Vaping Use: Never used  Substance Use Topics   Alcohol use: Never   Drug use: Never     Allergies   Patient has no known allergies.   Review of Systems Review of Systems Per HPI  Physical Exam Triage Vital Signs ED Triage Vitals  Enc Vitals Group     BP 07/30/22 1015 (!) 168/80     Pulse Rate 07/30/22 1015 (!) 109     Resp 07/30/22 1015 18     Temp 07/30/22 1015 97.9 F (36.6 C)     Temp Source 07/30/22 1015 Oral     SpO2 07/30/22 1015 96 %     Weight --      Height --      Head Circumference --      Peak Flow --      Pain Score 07/30/22 1013 10     Pain Loc --      Pain Edu? --      Excl. in [place]? --    No data found.  Updated Vital Signs BP (!) 168/80 (BP Location: Right Arm) Comment:  Pt reports she is dping follow up with her PCP for a new meds  Pulse (!) 109   Temp 97.9 F (36.6 C) (Oral)   Resp 18   SpO2 96%   Visual Acuity Right Eye Distance:   Left Eye Distance:   Bilateral Distance:    Right Eye Near:   Left Eye Near:    Bilateral Near:     Physical Exam Vitals and nursing note reviewed.  Constitutional:      General: She is not in acute distress.    Appearance: Normal appearance. She is not toxic-appearing.  HENT:     Mouth/Throat:     Mouth: Mucous membranes are moist.     Pharynx: Oropharynx is clear.  Pulmonary:     Effort: Pulmonary effort is normal. No respiratory distress.  Musculoskeletal:     Comments: Inspection: no swelling, obvious deformity or redness Palpation: left calcaneous inferiorly tender to palpation, achilles tendon nontender and negative calf squeeze; no obvious deformities palpated ROM: Full ROM to ankle and flexibility of foot; dorsiflexion of foot is painful Strength: difficult to assess secondary to pain Neurovascular:  neurovascularly intact in left lower extremity    Skin:    General: Skin is warm and dry.     Capillary Refill: Capillary refill takes less than 2 seconds.     Coloration: Skin is not jaundiced or pale.     Findings: No erythema.  Neurological:     Mental Status: She is alert and oriented to person, place, and time.     Motor: No weakness.     Gait: Gait abnormal (patient using knee scooter for mobility, not baseline for patient).      UC Treatments / Results  Labs (all labs ordered are listed, but only abnormal results are displayed) Labs Reviewed - No data to display  EKG   Radiology DG Ankle Complete Left  Result Date: 07/30/2022 CLINICAL DATA:  Ankle injury 7 years ago.  Lateral ankle pain. EXAM: LEFT ANKLE COMPLETE - 3+ VIEW COMPARISON:  None Available. FINDINGS: Mild distal medial malleolar and adjacent medial talar degenerative spurring. Mild distal fibula and lateral talar process degenerative osteophytosis. Small plantar calcaneal heel spur. Moderate high-grade chronic enthesopathic change/spur at the Achilles insertion on the calcaneus, measuring up to approximately 16 mm in craniocaudal length. No acute fracture or dislocation. No soft tissue swelling. IMPRESSION: 1. 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 2. Mild tibiotalar osteoarthritis. Electronically Signed   By: Yvonne Kendall M.D.   On: 07/30/2022 10:40    Procedures Procedures (including critical care time)  Medications Ordered in UC Medications - No data to display  Initial Impression / Assessment and Plan / UC Course  I have reviewed the triage vital signs and the nursing notes.  Pertinent labs & imaging results that were available during my care of the patient were reviewed by me and considered in my medical decision making (see chart for details).   Patient is well-appearing, afebrile, not tachypneic, oxygenating well on room air.  She is mildly  tachycardic and hypertensive-likely due to acute illness.  She also reports she is following up with her primary care provider for ongoing elevated blood pressure.  Enthesopathy of left ankle X-ray imaging today shows moderate high-grade chronic spur at the achilles insertion on the calcaneous.   ACE wrap applied Patient declines crutches as she has a knee scooter Alternate meloxicam with Tylenol Follow up with Podiatry for further evaluation and management;  contact information given ER and return precautions discussed  The patient was given the opportunity to ask questions.  All questions answered to their satisfaction.  The patient is in agreement to this plan.    Final Clinical Impressions(s) / UC Diagnoses   Final diagnoses:  Enthesopathy of left ankle     Discharge Instructions      The x-ray today shows chronic bone spur above your left heel which most likely means your achilles tendon has been inflamed for a while.  We have put an ace wrap around your ankle and heel today - please wear this to help with compression and stabilization.    Please follow up with a Podiatrist as soon as possible, I have attached contact information for the foot doctor in [place].  Continue meloxicam for pain; you can alternate with Tylenol [phone] mg every 6 hours for pain.  Do not take meloxicam with ibuprofen, Advil, or other NSAIDs.     ED Prescriptions   None    PDMP not reviewed this encounter.   Eulogio Bear, NP 07/30/22 1308

## 2022-08-02 ENCOUNTER — Telehealth: Payer: Self-pay | Admitting: *Deleted

## 2022-08-02 ENCOUNTER — Ambulatory Visit (INDEPENDENT_AMBULATORY_CARE_PROVIDER_SITE_OTHER): Payer: PPO | Admitting: Podiatry

## 2022-08-02 DIAGNOSIS — M722 Plantar fascial fibromatosis: Secondary | ICD-10-CM | POA: Diagnosis not present

## 2022-08-02 NOTE — Telephone Encounter (Signed)
Patient was seen today and given an injection,does she continue taking the meloxicam prescribed by another physician?

## 2022-08-02 NOTE — Progress Notes (Signed)
  Subjective:  Patient ID: Ellen Martinez, female    DOB: 1951-10-05,  MRN: [id number]  Chief Complaint  Patient presents with   Foot Pain    71 y.o. female presents with the above complaint.  Patient presents with complaint of left heel pain has been on for quite some time is progressive gotten worse and came out of nowhere she has multiple flares up in the year.  She had an x-ray done on 1023 wanted get it evaluated she takes meloxicam which helped.  She has not tried anything else for it.  She has not tried any other treatment options   Review of Systems: Negative except as noted in the HPI. Denies N/V/F/Ch.  Past Medical History:  Diagnosis Date   Hypertension     Current Outpatient Medications:    estazolam (PROSOM) 2 MG tablet, Take 2 mg by mouth at bedtime as needed., Disp: , Rfl:    losartan-hydrochlorothiazide (HYZAAR) 100-25 MG tablet, Take 1 tablet by mouth daily., Disp: , Rfl:    meloxicam (MOBIC) 7.5 MG tablet, Take 7.5 mg by mouth daily., Disp: , Rfl:    VITAMIN D PO, Take by mouth., Disp: , Rfl:    Zinc Sulfate (ZINC 15 PO), Take by mouth., Disp: , Rfl:   Social History   Tobacco Use  Smoking Status Never  Smokeless Tobacco Never    No Known Allergies Objective:  There were no vitals filed for this visit. There is no height or weight on file to calculate BMI. Constitutional Well developed. Well nourished.  Vascular Dorsalis pedis pulses palpable bilaterally. Posterior tibial pulses palpable bilaterally. Capillary refill normal to all digits.  No cyanosis or clubbing noted. Pedal hair growth normal.  Neurologic Normal speech. Oriented to person, place, and time. Epicritic sensation to light touch grossly present bilaterally.  Dermatologic Nails well groomed and normal in appearance. No open wounds. No skin lesions.  Orthopedic: Normal joint ROM without pain or crepitus bilaterally. No visible deformities. Tender to palpation at the calcaneal tuber  left. No pain with calcaneal squeeze left. Ankle ROM diminished range of motion left. Silfverskiold Test: positive left.   Radiographs: Taken and reviewed. No acute fractures or dislocations. No evidence of stress fracture.  Plantar heel spur present. Posterior heel spur present.  Pes planovalgus  Assessment:   1. Plantar fasciitis of left foot    Plan:  Patient was evaluated and treated and all questions answered.  Plantar Fasciitis, left - XR reviewed as above.  - Educated on icing and stretching. Instructions given.  - Injection delivered to the plantar fascia as below. - DME: Plantar fascial brace dispensed to support the medial longitudinal arch of the foot and offload pressure from the heel and prevent arch collapse during weightbearing - Pharmacologic management: None  Procedure: Injection Tendon/Ligament Location: Left plantar fascia at the glabrous junction; medial approach. Skin Prep: alcohol Injectate: 0.5 cc 0.5% marcaine plain, 0.5 cc of 1% Lidocaine, 0.5 cc kenalog 10. Disposition: Patient tolerated procedure well. Injection site dressed with a band-aid.  No follow-ups on file.

## 2022-08-03 NOTE — Telephone Encounter (Signed)
Called patient that per physician she may continue to take meloxicam , notified this thru voice message.

## 2022-08-21 DIAGNOSIS — L570 Actinic keratosis: Secondary | ICD-10-CM | POA: Diagnosis not present

## 2022-08-21 DIAGNOSIS — Z08 Encounter for follow-up examination after completed treatment for malignant neoplasm: Secondary | ICD-10-CM | POA: Diagnosis not present

## 2022-08-21 DIAGNOSIS — Z85828 Personal history of other malignant neoplasm of skin: Secondary | ICD-10-CM | POA: Diagnosis not present

## 2022-08-21 DIAGNOSIS — L308 Other specified dermatitis: Secondary | ICD-10-CM | POA: Diagnosis not present

## 2022-08-21 DIAGNOSIS — X32XXXD Exposure to sunlight, subsequent encounter: Secondary | ICD-10-CM | POA: Diagnosis not present

## 2022-09-06 DIAGNOSIS — R7303 Prediabetes: Secondary | ICD-10-CM | POA: Diagnosis not present

## 2022-09-06 DIAGNOSIS — E782 Mixed hyperlipidemia: Secondary | ICD-10-CM | POA: Diagnosis not present

## 2022-09-06 DIAGNOSIS — I10 Essential (primary) hypertension: Secondary | ICD-10-CM | POA: Diagnosis not present

## 2022-09-10 DIAGNOSIS — M722 Plantar fascial fibromatosis: Secondary | ICD-10-CM | POA: Diagnosis not present

## 2022-09-10 DIAGNOSIS — Z0000 Encounter for general adult medical examination without abnormal findings: Secondary | ICD-10-CM | POA: Diagnosis not present

## 2022-09-10 DIAGNOSIS — Z85828 Personal history of other malignant neoplasm of skin: Secondary | ICD-10-CM | POA: Diagnosis not present

## 2022-09-10 DIAGNOSIS — I10 Essential (primary) hypertension: Secondary | ICD-10-CM | POA: Diagnosis not present

## 2022-09-10 DIAGNOSIS — R7303 Prediabetes: Secondary | ICD-10-CM | POA: Diagnosis not present

## 2022-09-10 DIAGNOSIS — R42 Dizziness and giddiness: Secondary | ICD-10-CM | POA: Diagnosis not present

## 2022-09-10 DIAGNOSIS — E782 Mixed hyperlipidemia: Secondary | ICD-10-CM | POA: Diagnosis not present

## 2022-09-10 DIAGNOSIS — R944 Abnormal results of kidney function studies: Secondary | ICD-10-CM | POA: Diagnosis not present

## 2022-09-10 DIAGNOSIS — F5104 Psychophysiologic insomnia: Secondary | ICD-10-CM | POA: Diagnosis not present

## 2022-09-13 ENCOUNTER — Ambulatory Visit: Payer: PPO | Admitting: Podiatry

## 2022-09-13 DIAGNOSIS — M722 Plantar fascial fibromatosis: Secondary | ICD-10-CM

## 2022-09-13 NOTE — Progress Notes (Signed)
  Subjective:  Patient ID: Ellen Martinez, female    DOB: 07-11-1951,  MRN: [id number]  Chief Complaint  Patient presents with   Plantar Fasciitis    71 y.o. female presents with the above complaint.  Patient presents for follow-up to left plantar fasciitis.  Patient states that it is a lot better no further pain.  Denies any other acute complaints.   Review of Systems: Negative except as noted in the HPI. Denies N/V/F/Ch.  Past Medical History:  Diagnosis Date   Hypertension     Current Outpatient Medications:    estazolam (PROSOM) 2 MG tablet, Take 2 mg by mouth at bedtime as needed., Disp: , Rfl:    losartan-hydrochlorothiazide (HYZAAR) 100-25 MG tablet, Take 1 tablet by mouth daily., Disp: , Rfl:    meloxicam (MOBIC) 7.5 MG tablet, Take 7.5 mg by mouth daily., Disp: , Rfl:    VITAMIN D PO, Take by mouth., Disp: , Rfl:    Zinc Sulfate (ZINC 15 PO), Take by mouth., Disp: , Rfl:   Social History   Tobacco Use  Smoking Status Never  Smokeless Tobacco Never    No Known Allergies Objective:  There were no vitals filed for this visit. There is no height or weight on file to calculate BMI. Constitutional Well developed. Well nourished.  Vascular Dorsalis pedis pulses palpable bilaterally. Posterior tibial pulses palpable bilaterally. Capillary refill normal to all digits.  No cyanosis or clubbing noted. Pedal hair growth normal.  Neurologic Normal speech. Oriented to person, place, and time. Epicritic sensation to light touch grossly present bilaterally.  Dermatologic Nails well groomed and normal in appearance. No open wounds. No skin lesions.  Orthopedic: Normal joint ROM without pain or crepitus bilaterally. No visible deformities. No further tender to palpation at the calcaneal tuber left. No pain with calcaneal squeeze left. Ankle ROM diminished range of motion left. Silfverskiold Test: positive left.   Radiographs: Taken and reviewed. No acute fractures or  dislocations. No evidence of stress fracture.  Plantar heel spur present. Posterior heel spur present.  Pes planovalgus  Assessment:   No diagnosis found.  Plan:  Patient was evaluated and treated and all questions answered.  Plantar Fasciitis, left -Clinically healed officially discharged from my care after 1 injection.  I discussed shoe gear modification and orthotics she states understanding.  She will come back and see me if she has a recurrence of plantar fasciitis  Pes planovalgus -I explained to patient the etiology of pes planovalgus and relationship with Planter fasciitis and various treatment options were discussed.  Given patient foot structure in the setting of Planter fasciitis I believe patient will benefit from custom-made orthotics to help control the hindfoot motion support the arch of the foot and take the stress away from plantar fascial.  Patient agrees with the plan like to proceed with orthotics -Patient was casted for orthotics    No follow-ups on file.

## 2022-09-28 ENCOUNTER — Ambulatory Visit: Payer: PPO | Admitting: Podiatry

## 2022-10-04 ENCOUNTER — Ambulatory Visit: Payer: PPO | Admitting: Podiatry

## 2022-10-04 DIAGNOSIS — M722 Plantar fascial fibromatosis: Secondary | ICD-10-CM

## 2022-10-04 NOTE — Progress Notes (Signed)
  Subjective:  Patient ID: Ellen Martinez, female    DOB: 05/05/1951,  MRN: [id number]  Chief Complaint  Patient presents with   Foot Pain    Patient is here for left foot pain.    71 y.o. female presents with the above complaint.  Patient presents for follow-up of left Planter fasciitis.  She states the pain is starting to come back again.  She did admit a bit of heavy lifting.  She wanted to do another injection.  She has mild meloxicam as well.   Review of Systems: Negative except as noted in the HPI. Denies N/V/F/Ch.  Past Medical History:  Diagnosis Date   Hypertension     Current Outpatient Medications:    estazolam (PROSOM) 2 MG tablet, Take 2 mg by mouth at bedtime as needed., Disp: , Rfl:    losartan-hydrochlorothiazide (HYZAAR) 100-25 MG tablet, Take 1 tablet by mouth daily., Disp: , Rfl:    meloxicam (MOBIC) 7.5 MG tablet, Take 7.5 mg by mouth daily., Disp: , Rfl:    VITAMIN D PO, Take by mouth., Disp: , Rfl:    Zinc Sulfate (ZINC 15 PO), Take by mouth., Disp: , Rfl:   Social History   Tobacco Use  Smoking Status Never  Smokeless Tobacco Never    No Known Allergies Objective:  There were no vitals filed for this visit. There is no height or weight on file to calculate BMI. Constitutional Well developed. Well nourished.  Vascular Dorsalis pedis pulses palpable bilaterally. Posterior tibial pulses palpable bilaterally. Capillary refill normal to all digits.  No cyanosis or clubbing noted. Pedal hair growth normal.  Neurologic Normal speech. Oriented to person, place, and time. Epicritic sensation to light touch grossly present bilaterally.  Dermatologic Nails well groomed and normal in appearance. No open wounds. No skin lesions.  Orthopedic: Normal joint ROM without pain or crepitus bilaterally. No visible deformities. Tender to palpation at the calcaneal tuber left. No pain with calcaneal squeeze left. Ankle ROM diminished range of motion  left. Silfverskiold Test: positive left.   Radiographs: Taken and reviewed. No acute fractures or dislocations. No evidence of stress fracture.  Plantar heel spur present. Posterior heel spur present.  Pes planovalgus  Assessment:   1. Plantar fasciitis of left foot     Plan:  Patient was evaluated and treated and all questions answered.  Plantar Fasciitis, left~recurrence - XR reviewed as above.  - Educated on icing and stretching. Instructions given.  - Injection delivered to the plantar fascia as below. - DME: Plantar fascial brace dispensed to support the medial longitudinal arch of the foot and offload pressure from the heel and prevent arch collapse during weightbearing - Pharmacologic management: None  Pes planovalgus -I explained to patient the etiology of pes planovalgus and relationship with Planter fasciitis and various treatment options were discussed.  Given patient foot structure in the setting of Planter fasciitis I believe patient will benefit from custom-made orthotics to help control the hindfoot motion support the arch of the foot and take the stress away from plantar fascial.  Patient agrees with the plan like to proceed with orthotics -Patient was casted for orthotics   Procedure: Injection Tendon/Ligament Location: Left plantar fascia at the glabrous junction; medial approach. Skin Prep: alcohol Injectate: 0.5 cc 0.5% marcaine plain, 0.5 cc of 1% Lidocaine, 0.5 cc kenalog 10. Disposition: Patient tolerated procedure well. Injection site dressed with a band-aid.  No follow-ups on file.

## 2022-10-09 ENCOUNTER — Ambulatory Visit: Payer: PPO | Admitting: Podiatry

## 2022-10-19 ENCOUNTER — Encounter: Payer: Self-pay | Admitting: Podiatry

## 2022-10-19 ENCOUNTER — Telehealth: Payer: Self-pay | Admitting: Podiatry

## 2022-10-19 NOTE — Telephone Encounter (Signed)
Called patient to let them know there orthotics are in.    Balance pending - charges being put in

## 2022-10-19 NOTE — Telephone Encounter (Signed)
Pre auth faxed for orthotics

## 2022-11-06 ENCOUNTER — Ambulatory Visit: Payer: PPO | Admitting: Podiatry

## 2022-11-06 DIAGNOSIS — M722 Plantar fascial fibromatosis: Secondary | ICD-10-CM | POA: Diagnosis not present

## 2022-11-06 DIAGNOSIS — Q666 Other congenital valgus deformities of feet: Secondary | ICD-10-CM

## 2022-11-06 NOTE — Progress Notes (Signed)
Subjective:  Patient ID: Ellen Martinez, female    DOB: May 06, 1951,  MRN: [id number]  Chief Complaint  Patient presents with   Plantar Fasciitis    72 y.o. female presents with the above complaint.  Patient presents for follow-up of left Planter fasciitis.  She states injection helped some.  She is currently taking taking meloxicam.  She would like to discuss next treatment plan.  She denies any other acute complaints.   Review of Systems: Negative except as noted in the HPI. Denies N/V/F/Ch.  Past Medical History:  Diagnosis Date   Hypertension     Current Outpatient Medications:    estazolam (PROSOM) 2 MG tablet, Take 2 mg by mouth at bedtime as needed., Disp: , Rfl:    losartan-hydrochlorothiazide (HYZAAR) 100-25 MG tablet, Take 1 tablet by mouth daily., Disp: , Rfl:    meloxicam (MOBIC) 7.5 MG tablet, Take 7.5 mg by mouth daily., Disp: , Rfl:    VITAMIN D PO, Take by mouth., Disp: , Rfl:    Zinc Sulfate (ZINC 15 PO), Take by mouth., Disp: , Rfl:   Social History   Tobacco Use  Smoking Status Never  Smokeless Tobacco Never    No Known Allergies Objective:  There were no vitals filed for this visit. There is no height or weight on file to calculate BMI. Constitutional Well developed. Well nourished.  Vascular Dorsalis pedis pulses palpable bilaterally. Posterior tibial pulses palpable bilaterally. Capillary refill normal to all digits.  No cyanosis or clubbing noted. Pedal hair growth normal.  Neurologic Normal speech. Oriented to person, place, and time. Epicritic sensation to light touch grossly present bilaterally.  Dermatologic Nails well groomed and normal in appearance. No open wounds. No skin lesions.  Orthopedic: Normal joint ROM without pain or crepitus bilaterally. No visible deformities. Tender to palpation at the calcaneal tuber left. No pain with calcaneal squeeze left. Ankle ROM diminished range of motion left. Silfverskiold Test: positive left.    Radiographs: Taken and reviewed. No acute fractures or dislocations. No evidence of stress fracture.  Plantar heel spur present. Posterior heel spur present.  Pes planovalgus  Assessment:   1. Plantar fasciitis of left foot   2. Pes planovalgus     Plan:  Patient was evaluated and treated and all questions answered.  Plantar Fasciitis, left~recurrence - XR reviewed as above.  - Educated on icing and stretching. Instructions given.  - Injection delivered to the plantar fascia as below. - DME: Plantar fascial brace dispensed to support the medial longitudinal arch of the foot and offload pressure from the heel and prevent arch collapse during weightbearing - Pharmacologic management: None -If there is no improvement we will discuss cam boot immobilization during next visit.  She states understanding  Pes planovalgus -I explained to patient the etiology of pes planovalgus and relationship with Planter fasciitis and various treatment options were discussed.  Given patient foot structure in the setting of Planter fasciitis I believe patient will benefit from custom-made orthotics to help control the hindfoot motion support the arch of the foot and take the stress away from plantar fascial.  Patient agrees with the plan like to proceed with orthotics -Orthotics were dispensed and they are functioning well   Procedure: Injection Tendon/Ligament Location: Left plantar fascia at the glabrous junction; medial approach. Skin Prep: alcohol Injectate: 0.5 cc 0.5% marcaine plain, 0.5 cc of 1% Lidocaine, 0.5 cc kenalog 10. Disposition: Patient tolerated procedure well. Injection site dressed with a band-aid.  No follow-ups on file.

## 2022-12-07 ENCOUNTER — Ambulatory Visit: Payer: PPO | Admitting: Podiatry

## 2022-12-07 DIAGNOSIS — M722 Plantar fascial fibromatosis: Secondary | ICD-10-CM | POA: Diagnosis not present

## 2022-12-07 NOTE — Progress Notes (Signed)
  Subjective:  Patient ID: Ellen Martinez, female    DOB: [birth year],  MRN: [id number]  Chief Complaint  Patient presents with   Plantar Fasciitis    72 y.o. female presents with the above complaint.  Patient presents for follow-up of left Planter fasciitis she states she is doing much better she does not have any pain.  She states the injection helped considerably the orthotics helped a lot.   Review of Systems: Negative except as noted in the HPI. Denies N/V/F/Ch.  Past Medical History:  Diagnosis Date   Hypertension     Current Outpatient Medications:    estazolam (PROSOM) 2 MG tablet, Take 2 mg by mouth at bedtime as needed., Disp: , Rfl:    losartan-hydrochlorothiazide (HYZAAR) 100-25 MG tablet, Take 1 tablet by mouth daily., Disp: , Rfl:    meloxicam (MOBIC) 7.5 MG tablet, Take 7.5 mg by mouth daily., Disp: , Rfl:    VITAMIN D PO, Take by mouth., Disp: , Rfl:    Zinc Sulfate (ZINC 15 PO), Take by mouth., Disp: , Rfl:   Social History   Tobacco Use  Smoking Status Never  Smokeless Tobacco Never    No Known Allergies Objective:  There were no vitals filed for this visit. There is no height or weight on file to calculate BMI. Constitutional Well developed. Well nourished.  Vascular Dorsalis pedis pulses palpable bilaterally. Posterior tibial pulses palpable bilaterally. Capillary refill normal to all digits.  No cyanosis or clubbing noted. Pedal hair growth normal.  Neurologic Normal speech. Oriented to person, place, and time. Epicritic sensation to light touch grossly present bilaterally.  Dermatologic Nails well groomed and normal in appearance. No open wounds. No skin lesions.  Orthopedic: Normal joint ROM without pain or crepitus bilaterally. No visible deformities. No further tender to palpation at the calcaneal tuber left. No pain with calcaneal squeeze left. Ankle ROM diminished range of motion left. Silfverskiold Test: positive left.   Radiographs:  Taken and reviewed. No acute fractures or dislocations. No evidence of stress fracture.  Plantar heel spur present. Posterior heel spur present.  Pes planovalgus  Assessment:   No diagnosis found.   Plan:  Patient was evaluated and treated and all questions answered.  Plantar Fasciitis, left~recurrence -Clinically healed and officially discharged from my care I discussed shoe gear modification orthotics management she states understanding and will continue to wear them.  If any foot and ankle issues on future she will come back and see me  Pes planovalgus -I explained to patient the etiology of pes planovalgus and relationship with Planter fasciitis and various treatment options were discussed.  Given patient foot structure in the setting of Planter fasciitis I believe patient will benefit from custom-made orthotics to help control the hindfoot motion support the arch of the foot and take the stress away from plantar fascial.  Patient agrees with the plan like to proceed with orthotics -Orthotics were dispensed and they are functioning well   .  No follow-ups on file.

## 2022-12-17 DIAGNOSIS — L918 Other hypertrophic disorders of the skin: Secondary | ICD-10-CM | POA: Diagnosis not present

## 2022-12-17 DIAGNOSIS — L308 Other specified dermatitis: Secondary | ICD-10-CM | POA: Diagnosis not present

## 2022-12-17 DIAGNOSIS — X32XXXD Exposure to sunlight, subsequent encounter: Secondary | ICD-10-CM | POA: Diagnosis not present

## 2022-12-17 DIAGNOSIS — L570 Actinic keratosis: Secondary | ICD-10-CM | POA: Diagnosis not present

## 2023-03-08 DIAGNOSIS — I10 Essential (primary) hypertension: Secondary | ICD-10-CM | POA: Diagnosis not present

## 2023-03-08 DIAGNOSIS — R7303 Prediabetes: Secondary | ICD-10-CM | POA: Diagnosis not present

## 2023-03-08 DIAGNOSIS — E782 Mixed hyperlipidemia: Secondary | ICD-10-CM | POA: Diagnosis not present

## 2023-03-12 DIAGNOSIS — X32XXXD Exposure to sunlight, subsequent encounter: Secondary | ICD-10-CM | POA: Diagnosis not present

## 2023-03-12 DIAGNOSIS — L570 Actinic keratosis: Secondary | ICD-10-CM | POA: Diagnosis not present

## 2023-03-14 DIAGNOSIS — I10 Essential (primary) hypertension: Secondary | ICD-10-CM | POA: Diagnosis not present

## 2023-03-14 DIAGNOSIS — Z85828 Personal history of other malignant neoplasm of skin: Secondary | ICD-10-CM | POA: Diagnosis not present

## 2023-03-14 DIAGNOSIS — R944 Abnormal results of kidney function studies: Secondary | ICD-10-CM | POA: Diagnosis not present

## 2023-03-14 DIAGNOSIS — R7303 Prediabetes: Secondary | ICD-10-CM | POA: Diagnosis not present

## 2023-03-14 DIAGNOSIS — R42 Dizziness and giddiness: Secondary | ICD-10-CM | POA: Diagnosis not present

## 2023-03-14 DIAGNOSIS — F5104 Psychophysiologic insomnia: Secondary | ICD-10-CM | POA: Diagnosis not present

## 2023-03-14 DIAGNOSIS — M722 Plantar fascial fibromatosis: Secondary | ICD-10-CM | POA: Diagnosis not present

## 2023-03-14 DIAGNOSIS — E782 Mixed hyperlipidemia: Secondary | ICD-10-CM | POA: Diagnosis not present

## 2023-03-14 DIAGNOSIS — Z79899 Other long term (current) drug therapy: Secondary | ICD-10-CM | POA: Diagnosis not present

## 2023-03-14 DIAGNOSIS — Z0000 Encounter for general adult medical examination without abnormal findings: Secondary | ICD-10-CM | POA: Diagnosis not present

## 2023-07-29 DIAGNOSIS — H01002 Unspecified blepharitis right lower eyelid: Secondary | ICD-10-CM | POA: Diagnosis not present

## 2023-07-29 DIAGNOSIS — H40013 Open angle with borderline findings, low risk, bilateral: Secondary | ICD-10-CM | POA: Diagnosis not present

## 2023-07-29 DIAGNOSIS — H25812 Combined forms of age-related cataract, left eye: Secondary | ICD-10-CM | POA: Diagnosis not present

## 2023-07-29 DIAGNOSIS — H2511 Age-related nuclear cataract, right eye: Secondary | ICD-10-CM | POA: Diagnosis not present

## 2023-07-29 DIAGNOSIS — H01001 Unspecified blepharitis right upper eyelid: Secondary | ICD-10-CM | POA: Diagnosis not present

## 2023-08-22 DIAGNOSIS — H43813 Vitreous degeneration, bilateral: Secondary | ICD-10-CM | POA: Diagnosis not present

## 2023-08-22 DIAGNOSIS — H25813 Combined forms of age-related cataract, bilateral: Secondary | ICD-10-CM | POA: Diagnosis not present

## 2023-08-22 DIAGNOSIS — H35371 Puckering of macula, right eye: Secondary | ICD-10-CM | POA: Diagnosis not present

## 2023-08-22 DIAGNOSIS — H35033 Hypertensive retinopathy, bilateral: Secondary | ICD-10-CM | POA: Diagnosis not present

## 2023-09-04 DIAGNOSIS — D0462 Carcinoma in situ of skin of left upper limb, including shoulder: Secondary | ICD-10-CM | POA: Diagnosis not present

## 2023-09-04 DIAGNOSIS — C44712 Basal cell carcinoma of skin of right lower limb, including hip: Secondary | ICD-10-CM | POA: Diagnosis not present

## 2023-09-04 DIAGNOSIS — C44311 Basal cell carcinoma of skin of nose: Secondary | ICD-10-CM | POA: Diagnosis not present

## 2023-09-04 DIAGNOSIS — C44722 Squamous cell carcinoma of skin of right lower limb, including hip: Secondary | ICD-10-CM | POA: Diagnosis not present

## 2023-09-09 DIAGNOSIS — R7303 Prediabetes: Secondary | ICD-10-CM | POA: Diagnosis not present

## 2023-09-09 DIAGNOSIS — E782 Mixed hyperlipidemia: Secondary | ICD-10-CM | POA: Diagnosis not present

## 2023-09-09 DIAGNOSIS — I10 Essential (primary) hypertension: Secondary | ICD-10-CM | POA: Diagnosis not present

## 2023-09-13 DIAGNOSIS — J069 Acute upper respiratory infection, unspecified: Secondary | ICD-10-CM | POA: Diagnosis not present

## 2023-09-13 DIAGNOSIS — Z713 Dietary counseling and surveillance: Secondary | ICD-10-CM | POA: Diagnosis not present

## 2023-09-13 DIAGNOSIS — R42 Dizziness and giddiness: Secondary | ICD-10-CM | POA: Diagnosis not present

## 2023-09-13 DIAGNOSIS — R944 Abnormal results of kidney function studies: Secondary | ICD-10-CM | POA: Diagnosis not present

## 2023-09-13 DIAGNOSIS — E782 Mixed hyperlipidemia: Secondary | ICD-10-CM | POA: Diagnosis not present

## 2023-09-13 DIAGNOSIS — Z85828 Personal history of other malignant neoplasm of skin: Secondary | ICD-10-CM | POA: Diagnosis not present

## 2023-09-13 DIAGNOSIS — D72829 Elevated white blood cell count, unspecified: Secondary | ICD-10-CM | POA: Diagnosis not present

## 2023-09-13 DIAGNOSIS — R7303 Prediabetes: Secondary | ICD-10-CM | POA: Diagnosis not present

## 2023-09-13 DIAGNOSIS — I10 Essential (primary) hypertension: Secondary | ICD-10-CM | POA: Diagnosis not present

## 2023-09-13 DIAGNOSIS — Z79899 Other long term (current) drug therapy: Secondary | ICD-10-CM | POA: Diagnosis not present

## 2023-09-13 DIAGNOSIS — F5104 Psychophysiologic insomnia: Secondary | ICD-10-CM | POA: Diagnosis not present

## 2023-09-13 DIAGNOSIS — M722 Plantar fascial fibromatosis: Secondary | ICD-10-CM | POA: Diagnosis not present

## 2023-10-08 DIAGNOSIS — C44311 Basal cell carcinoma of skin of nose: Secondary | ICD-10-CM | POA: Diagnosis not present

## 2023-10-08 DIAGNOSIS — D485 Neoplasm of uncertain behavior of skin: Secondary | ICD-10-CM | POA: Diagnosis not present

## 2023-10-14 ENCOUNTER — Other Ambulatory Visit: Payer: Self-pay

## 2023-10-14 ENCOUNTER — Encounter (HOSPITAL_COMMUNITY): Payer: Self-pay

## 2023-10-14 ENCOUNTER — Encounter (HOSPITAL_COMMUNITY)
Admission: RE | Admit: 2023-10-14 | Discharge: 2023-10-14 | Disposition: A | Discharge status: Home / Self Care | Payer: PPO | Source: Ambulatory Visit | Attending: Ophthalmology | Admitting: Ophthalmology

## 2023-10-14 HISTORY — DX: Anxiety disorder, unspecified: F41.9

## 2023-10-16 NOTE — H&P (Signed)
 Surgical History & Physical  Patient Name: Ellen Martinez  DOB: 1951-06-17  Surgery: Cataract extraction with intraocular lens implant phacoemulsification; Right Eye Surgeon: Lynwood Hermann MD Surgery Date: 10/21/2023 Pre-Op Date: 07/29/2023  HPI: A 58 Yr. old female patient present for cataract eval per Dr. Darroll. 1. The patient complains of difficulty when driving during the day and at night, watching TV, any kind of reading, blurred vision, which began months ago. Both eyes are affected, OD>OS. The symptoms are constant. The condition's severity is worsening. This is negatively affecting the patient's quality of life and the patient is unable to function adequately in life with the current level of vision. Both eyes water . Have used Systane in the past but it didn't help so she stopped. HPI was performed by Lynwood Hermann .  Medical History: Dry Eyes Cataracts  Cancer High Blood Pressure  Review of Systems Cardiovascular High Blood Pressure All recorded systems are negative except as noted above.  Social Never smoked   Medication Zyrtec ,  estazolam ,  amlodipine ,  losartan-hydrochlorothiazide   Sx/Procedures Hysterectomy  Drug Allergies  NKDA  History & Physical: Heent: cataracts NECK: supple without bruits LUNGS: lungs clear to auscultation CV: regular rate and rhythm Abdomen: soft and non-tender  Impression & Plan: Assessment: 1.  COMBINED FORMS AGE RELATED CATARACT; Left Eye (H25.812) 2.  NUCLEAR SCLEROSIS AGE RELATED; Right Eye (H25.11) 3.  BLEPHARITIS; Right Upper Lid, Right Lower Lid, Left Upper Lid, Left Lower Lid (H01.001, H01.002,H01.004,H01.005) 4.  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  ASTIGMATISM, REGULAR; Both Eyes (H52.223) 7.  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  Cataract accounts for the patient's decreased vision. This visual impairment is not correctable with a  tolerable change in glasses or contact lenses. Cataract surgery with an implantation of a new lens should significantly improve the visual and functional status of the patient. Discussed all risks, benefits, alternatives, and potential complications. Discussed the procedures and recovery. Patient desires to have surgery. A-scan ordered and performed today for intra-ocular lens calculations. The surgery will be performed in order to improve vision for driving, reading, and for eye examinations. Recommend phacoemulsification with intra-ocular lens. Recommend Dextenza  for post-operative pain and inflammation. Left Eye. Dilates well - shugarcaine by protocol. Recommend Toric Lens.  2.  Will address after left eye and retina clearance.  3.  Blepharitis is present - recommend regular lid cleaning.  4.  Old Asymptomatic. RD precautions given. Patient to call with increase in flashing lights/floaters/dark curtain.  5.  Based on cup-to-disc ratio. Negative Family history. OCT rNFL shows: WNL OU 07-29-23 IOPs WNL OU. Follow.  6.  Recommend toric IOL OU.  7.  Appears very significant with intra-retinal fluid. Will refer to retina for eval and clearance for cataract surgery prior to likely PPV with membrane peel OD.

## 2023-10-28 DIAGNOSIS — H2511 Age-related nuclear cataract, right eye: Secondary | ICD-10-CM | POA: Diagnosis not present

## 2023-10-29 DIAGNOSIS — X32XXXD Exposure to sunlight, subsequent encounter: Secondary | ICD-10-CM | POA: Diagnosis not present

## 2023-10-29 DIAGNOSIS — Z85828 Personal history of other malignant neoplasm of skin: Secondary | ICD-10-CM | POA: Diagnosis not present

## 2023-10-29 DIAGNOSIS — Z08 Encounter for follow-up examination after completed treatment for malignant neoplasm: Secondary | ICD-10-CM | POA: Diagnosis not present

## 2023-10-29 DIAGNOSIS — L570 Actinic keratosis: Secondary | ICD-10-CM | POA: Diagnosis not present

## 2023-10-30 ENCOUNTER — Other Ambulatory Visit (HOSPITAL_COMMUNITY): Payer: PPO

## 2023-10-30 ENCOUNTER — Encounter (HOSPITAL_COMMUNITY): Payer: Self-pay

## 2023-10-30 ENCOUNTER — Encounter (HOSPITAL_COMMUNITY)
Admission: RE | Admit: 2023-10-30 | Discharge: 2023-10-30 | Disposition: A | Discharge status: Home / Self Care | Payer: PPO | Source: Ambulatory Visit | Attending: Ophthalmology | Admitting: Ophthalmology

## 2023-10-30 NOTE — H&P (Signed)
Surgical History & Physical  Patient Name: Ellen Martinez  DOB: July 15, 1951  Surgery: Cataract extraction with intraocular lens implant phacoemulsification; Right Eye Surgeon: Fabio Pierce MD Surgery Date: 11/04/2023 Pre-Op Date: 10/30/2023  HPI: A 72 Yr. old female patient present for cataract eval per Dr. Charise Killian. 1. The patient complains of difficulty when driving during the day and at night, watching TV, any kind of reading, blurred vision, which began months ago. Both eyes are affected, OD>OS. The symptoms are constant. The condition's severity is worsening. This is negatively affecting the patient's quality of life and the patient is unable to function adequately in life with the current level of vision. Both eyes water. Have used Systane in the past but it didn't help so she stopped. HPI was performed by Fabio Pierce .  Medical History: Dry Eyes Cataracts  Cancer High Blood Pressure  Review of Systems Cardiovascular High Blood Pressure All recorded systems are negative except as noted above.  Social Never smoked   Medication Zyrtec ,  estazolam ,  amlodipine ,  losartan-hydrochlorothiazide   Sx/Procedures Hysterectomy  Drug Allergies  NKDA  History & Physical: Heent: cataract NECK: supple without bruits LUNGS: lungs clear to auscultation CV: regular rate and rhythm Abdomen: soft and non-tender  Impression & Plan: Assessment: 1.  COMBINED FORMS AGE RELATED CATARACT; Left Eye (H25.812) 2.  NUCLEAR SCLEROSIS AGE RELATED; Right Eye (H25.11) 3.  BLEPHARITIS; Right Upper Lid, Right Lower Lid, Left Upper Lid, Left Lower Lid (H01.001, H01.002,H01.004,H01.005) 4.  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  ASTIGMATISM, REGULAR; Both Eyes (H52.223) 7.  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  Cataract accounts for the patient's decreased vision. This visual impairment is not correctable with a  tolerable change in glasses or contact lenses. Cataract surgery with an implantation of a new lens should significantly improve the visual and functional status of the patient. Discussed all risks, benefits, alternatives, and potential complications. Discussed the procedures and recovery. Patient desires to have surgery. A-scan ordered and performed today for intra-ocular lens calculations. The surgery will be performed in order to improve vision for driving, reading, and for eye examinations. Recommend phacoemulsification with intra-ocular lens. Recommend Dextenza for post-operative pain and inflammation. Left Eye. Dilates well - shugarcaine by protocol. Recommend Toric Lens.  2.  Will address after left eye and retina clearance.  3.  Blepharitis is present - recommend regular lid cleaning.  4.  Old Asymptomatic. RD precautions given. Patient to call with increase in flashing lights/floaters/dark curtain.  5.  Based on cup-to-disc ratio. Negative Family history. OCT rNFL shows: WNL OU 07-29-23 IOPs WNL OU. Follow.  6.  Recommend toric IOL OU.  7.  Appears very significant with intra-retinal fluid. Will refer to retina for eval and clearance for cataract surgery prior to likely PPV with membrane peel OD.

## 2023-11-04 ENCOUNTER — Ambulatory Visit (HOSPITAL_COMMUNITY): Payer: PPO | Admitting: Anesthesiology

## 2023-11-04 ENCOUNTER — Encounter (HOSPITAL_COMMUNITY)
Admission: RE | Disposition: A | Discharge status: Home / Self Care | Payer: Self-pay | Source: Home / Self Care | Attending: Ophthalmology

## 2023-11-04 ENCOUNTER — Other Ambulatory Visit: Payer: Self-pay

## 2023-11-04 ENCOUNTER — Ambulatory Visit (HOSPITAL_COMMUNITY)
Admission: RE | Admit: 2023-11-04 | Discharge: 2023-11-04 | Disposition: A | Discharge status: Home / Self Care | Payer: PPO | Attending: Ophthalmology | Admitting: Ophthalmology

## 2023-11-04 ENCOUNTER — Encounter (HOSPITAL_COMMUNITY): Payer: Self-pay | Admitting: Ophthalmology

## 2023-11-04 DIAGNOSIS — H43811 Vitreous degeneration, right eye: Secondary | ICD-10-CM | POA: Insufficient documentation

## 2023-11-04 DIAGNOSIS — H259 Unspecified age-related cataract: Secondary | ICD-10-CM | POA: Diagnosis not present

## 2023-11-04 DIAGNOSIS — H40013 Open angle with borderline findings, low risk, bilateral: Secondary | ICD-10-CM | POA: Diagnosis not present

## 2023-11-04 DIAGNOSIS — H0100A Unspecified blepharitis right eye, upper and lower eyelids: Secondary | ICD-10-CM | POA: Diagnosis not present

## 2023-11-04 DIAGNOSIS — H2511 Age-related nuclear cataract, right eye: Secondary | ICD-10-CM

## 2023-11-04 DIAGNOSIS — H35371 Puckering of macula, right eye: Secondary | ICD-10-CM | POA: Diagnosis not present

## 2023-11-04 DIAGNOSIS — H25812 Combined forms of age-related cataract, left eye: Secondary | ICD-10-CM | POA: Diagnosis not present

## 2023-11-04 DIAGNOSIS — I10 Essential (primary) hypertension: Secondary | ICD-10-CM | POA: Diagnosis not present

## 2023-11-04 DIAGNOSIS — H0100B Unspecified blepharitis left eye, upper and lower eyelids: Secondary | ICD-10-CM | POA: Insufficient documentation

## 2023-11-04 DIAGNOSIS — H52201 Unspecified astigmatism, right eye: Secondary | ICD-10-CM | POA: Diagnosis present

## 2023-11-04 DIAGNOSIS — F419 Anxiety disorder, unspecified: Secondary | ICD-10-CM | POA: Diagnosis not present

## 2023-11-04 HISTORY — PX: CATARACT EXTRACTION W/PHACO: SHX586

## 2023-11-04 SURGERY — PHACOEMULSIFICATION, CATARACT, WITH IOL INSERTION
Anesthesia: Monitor Anesthesia Care | Site: Eye | Laterality: Right

## 2023-11-04 MED ORDER — MIDAZOLAM HCL 2 MG/2ML IJ SOLN
INTRAMUSCULAR | Status: DC | PRN
  Administered 2023-11-04: 2 mg via INTRAVENOUS

## 2023-11-04 MED ORDER — SODIUM CHLORIDE 0.9% FLUSH: 3.0000 mL | INTRAVENOUS | Status: DC | PRN

## 2023-11-04 MED ORDER — MOXIFLOXACIN HCL 5 MG/ML IO SOLN
INTRAOCULAR | Status: AC
  Filled 2023-11-04: qty 10

## 2023-11-04 MED ORDER — TROPICAMIDE 1 % OP SOLN
1.0000 [drp] | OPHTHALMIC | Status: AC | PRN
  Administered 2023-11-04 (×3): 1 [drp] via OPHTHALMIC

## 2023-11-04 MED ORDER — MIDAZOLAM HCL 2 MG/2ML IJ SOLN
INTRAMUSCULAR | Status: AC
Start: 2023-11-04 — End: ?
  Filled 2023-11-04: qty 2

## 2023-11-04 MED ORDER — EPINEPHRINE PF 1 MG/ML IJ SOLN
INTRAOCULAR | Status: DC | PRN
  Administered 2023-11-04: 500 mL

## 2023-11-04 MED ORDER — BSS IO SOLN
INTRAOCULAR | Status: DC | PRN
  Administered 2023-11-04: 15 mL via INTRAOCULAR

## 2023-11-04 MED ORDER — TETRACAINE HCL 0.5 % OP SOLN
1.0000 [drp] | OPHTHALMIC | Status: AC | PRN
  Administered 2023-11-04 (×3): 1 [drp] via OPHTHALMIC

## 2023-11-04 MED ORDER — SODIUM CHLORIDE 0.9% FLUSH: 3.0000 mL | Freq: Two times a day (BID) | INTRAVENOUS | Status: DC

## 2023-11-04 MED ORDER — TRYPAN BLUE 0.06 % IO SOSY
PREFILLED_SYRINGE | INTRAOCULAR | Status: AC
  Filled 2023-11-04: qty 0.5

## 2023-11-04 MED ORDER — EPINEPHRINE PF 1 MG/ML IJ SOLN
INTRAMUSCULAR | Status: AC
  Filled 2023-11-04: qty 18

## 2023-11-04 MED ORDER — PHENYLEPHRINE-KETOROLAC 1-0.3 % IO SOLN
INTRAOCULAR | Status: AC
  Filled 2023-11-04 (×4): qty 4

## 2023-11-04 MED ORDER — SODIUM HYALURONATE 10 MG/ML IO SOLUTION
PREFILLED_SYRINGE | INTRAOCULAR | Status: DC | PRN
  Administered 2023-11-04: .85 mL via INTRAOCULAR

## 2023-11-04 MED ORDER — PHENYLEPHRINE HCL 2.5 % OP SOLN
1.0000 [drp] | OPHTHALMIC | Status: AC | PRN
  Administered 2023-11-04 (×3): 1 [drp] via OPHTHALMIC

## 2023-11-04 MED ORDER — MOXIFLOXACIN HCL 5 MG/ML IO SOLN
INTRAOCULAR | Status: DC | PRN
  Administered 2023-11-04: .3 mL via INTRACAMERAL

## 2023-11-04 MED ORDER — STERILE WATER FOR IRRIGATION IR SOLN
Status: DC | PRN
  Administered 2023-11-04: 1

## 2023-11-04 MED ORDER — POVIDONE-IODINE 5 % OP SOLN
OPHTHALMIC | Status: DC | PRN
  Administered 2023-11-04: 1 via OPHTHALMIC

## 2023-11-04 MED ORDER — SODIUM HYALURONATE 23MG/ML IO SOSY
PREFILLED_SYRINGE | INTRAOCULAR | Status: DC | PRN
  Administered 2023-11-04: .6 mL via INTRAOCULAR

## 2023-11-04 MED ORDER — LIDOCAINE HCL (PF) 1 % IJ SOLN
INTRAOCULAR | Status: DC | PRN
  Administered 2023-11-04: 1 mL via OPHTHALMIC

## 2023-11-04 MED ORDER — LIDOCAINE HCL 3.5 % OP GEL: 1.0000 | Freq: Once | OPHTHALMIC | Status: DC

## 2023-11-04 SURGICAL SUPPLY — 13 items
CATARACT SUITE SIGHTPATH (MISCELLANEOUS) ×1 IMPLANT
CLOTH BEACON ORANGE TIMEOUT ST (SAFETY) ×1 IMPLANT
EYE SHIELD UNIVERSAL CLEAR (GAUZE/BANDAGES/DRESSINGS) IMPLANT
FEE CATARACT SUITE SIGHTPATH (MISCELLANEOUS) ×1 IMPLANT
GLOVE BIOGEL PI IND STRL 7.0 (GLOVE) ×2 IMPLANT
LENS IOL EYHANCE TRC 150 20.0 IMPLANT
LENS IOL TORIC DIU150 20.0 ×1 IMPLANT
NDL HYPO 18GX1.5 BLUNT FILL (NEEDLE) ×1 IMPLANT
NEEDLE HYPO 18GX1.5 BLUNT FILL (NEEDLE) ×1 IMPLANT
PAD ARMBOARD 7.5X6 YLW CONV (MISCELLANEOUS) ×1 IMPLANT
SYR TB 1ML LL NO SAFETY (SYRINGE) ×1 IMPLANT
TAPE SURG TRANSPORE 1 IN (GAUZE/BANDAGES/DRESSINGS) IMPLANT
WATER STERILE IRR 250ML POUR (IV SOLUTION) ×1 IMPLANT

## 2023-11-04 NOTE — Interval H&P Note (Signed)
History and Physical Interval Note:  11/04/2023 7:48 AM  Ellen Martinez  has presented today for surgery, with the diagnosis of age related nuclear cataract, right eye.  The various methods of treatment have been discussed with the patient and family. After consideration of risks, benefits and other options for treatment, the patient has consented to  Procedure(s): CATARACT EXTRACTION PHACO AND INTRAOCULAR LENS PLACEMENT (IOC) (Right) as a surgical intervention.  The patient's history has been reviewed, patient examined, no change in status, stable for surgery.  I have reviewed the patient's chart and labs.  Questions were answered to the patient's satisfaction.     Fabio Pierce

## 2023-11-04 NOTE — Discharge Instructions (Addendum)
Please discharge patient when stable, will follow up today with Dr. June Leap at the [facility] office immediately following discharge.  Leave shield in place until visit.  All paperwork with discharge instructions will be given at the office.  [facility] Address:  [address]  [place], [place] [address]

## 2023-11-04 NOTE — Transfer of Care (Signed)
Immediate Anesthesia Transfer of Care Note  Patient: Ellen Martinez  Procedure(s) Performed: CATARACT EXTRACTION PHACO AND INTRAOCULAR LENS PLACEMENT (IOC) (Right: Eye)  Patient Location: PACU  Anesthesia Type:MAC  Level of Consciousness: awake, alert , and oriented  Airway & Oxygen Therapy: Patient Spontanous Breathing  Post-op Assessment: Report given to RN and Post -op Vital signs reviewed and stable  Post vital signs: Reviewed and stable  Last Vitals:  Vitals Value Taken Time  BP 138/66 11/04/23 0823  Temp 36.7 C 11/04/23 0823  Pulse 85 11/04/23 0823  Resp 14 11/04/23 0823  SpO2 96 % 11/04/23 0823    Last Pain:  Vitals:   11/04/23 0823  TempSrc: Oral  PainSc: 0-No pain         Complications: No notable events documented.

## 2023-11-04 NOTE — Op Note (Addendum)
aDate of procedure: 11/04/23  Pre-operative diagnosis: Visually significant age-related nuclear cataract, Right Eye; Visually Significant Astigmatism, Right Eye (H25.11)  Post-operative diagnosis: Visually significant age-related cataract, Right Eye; Visually Significant Astigmatism, Right Eye  Procedure: Removal of cataract via phacoemulsification and insertion of intra-ocular lens Laural Benes and Johnson DIU150 +20.0D into the capsular bag of the Right Eye  Attending surgeon: Rudy Jew. Shannell Mikkelsen, MD, MA  Anesthesia: MAC, Topical Akten  Complications: None  Estimated Blood Loss: <42mL (minimal)  Specimens: None  Implants: As above  Indications:  Visually significant age-related cataract, Right Eye; Visually Significant Astigmatism, Right Eye  Procedure:  The patient was seen and identified in the pre-operative area. The operative eye was identified and dilated.  The operative eye was marked.  Pre-operative toric markers were used to mark the eye at 0 and 180 degrees. Topical anesthesia was administered to the operative eye.     The patient was then to the operative suite and placed in the supine position.  A timeout was performed confirming the patient, procedure to be performed, and all other relevant information.   The patient's face was prepped and draped in the usual fashion for intra-ocular surgery.  A lid speculum was placed into the operative eye and the surgical microscope moved into place and focused.  A superotemporal paracentesis was created using a 20 gauge paracentesis blade.  Shugarcaine was injected into the anterior chamber.  Viscoelastic was injected into the anterior chamber.  A temporal clear-corneal main wound incision was created using a 2.1mm microkeratome.  A continuous curvilinear capsulorrhexis was initiated using an irrigating cystitome and completed using capsulorrhexis forceps.  Hydrodissection and hydrodeliniation were performed.  Viscoelastic was injected into the  anterior chamber.  A phacoemulsification handpiece and a chopper as a second instrument were used to remove the nucleus and epinucleus. The irrigation/aspiration handpiece was used to remove any remaining cortical material.   The capsular bag was reinflated with viscoelastic, checked, and found to be intact.  The intraocular lens was inserted into the capsular bag and dialed into place using a Kuglen hook to 74 degrees.  The irrigation/aspiration handpiece was used to remove any remaining viscoelastic.  The clear corneal wound and paracentesis wounds were then hydrated and checked with Weck-Cels to be watertight. 0.70mL of moxifloxacin was injected into the anterior chamber. The lid-speculum and drape was removed, and the patient's face was cleaned with a wet and dry 4x4. A clear shield was taped over the eye. The patient was taken to the post-operative care unit in good condition, having tolerated the procedure well.  Post-Op Instructions: The patient will follow up at [facility] for a same day post-operative evaluation and will receive all other orders and instructions.

## 2023-11-04 NOTE — Anesthesia Preprocedure Evaluation (Signed)
Anesthesia Evaluation  Patient identified by MRN, date of birth, ID band Patient awake    Reviewed: Allergy & Precautions, H&P , NPO status , Patient's Chart, lab work & pertinent test results, reviewed documented beta blocker date and time   Airway Mallampati: II  TM Distance: >3 FB Neck ROM: full    Dental no notable dental hx.    Pulmonary neg pulmonary ROS   Pulmonary exam normal breath sounds clear to auscultation       Cardiovascular Exercise Tolerance: Good hypertension,  Rhythm:regular Rate:Normal     Neuro/Psych   Anxiety      Neuromuscular disease  negative psych ROS   GI/Hepatic negative GI ROS, Neg liver ROS,,,  Endo/Other  negative endocrine ROS    Renal/GU negative Renal ROS  negative genitourinary   Musculoskeletal   Abdominal   Peds  Hematology negative hematology ROS (+)   Anesthesia Other Findings   Reproductive/Obstetrics negative OB ROS                             Anesthesia Physical Anesthesia Plan  ASA: 2  Anesthesia Plan:    Post-op Pain Management:    Induction:   PONV Risk Score and Plan:   Airway Management Planned:   Additional Equipment:   Intra-op Plan:   Post-operative Plan:   Informed Consent: I have reviewed the patients History and Physical, chart, labs and discussed the procedure including the risks, benefits and alternatives for the proposed anesthesia with the patient or authorized representative who has indicated his/her understanding and acceptance.     Dental Advisory Given  Plan Discussed with: CRNA  Anesthesia Plan Comments:        Anesthesia Quick Evaluation

## 2023-11-05 ENCOUNTER — Encounter (HOSPITAL_COMMUNITY): Payer: Self-pay | Admitting: Ophthalmology

## 2023-11-07 NOTE — Anesthesia Postprocedure Evaluation (Signed)
Anesthesia Post Note  Patient: Ellen Martinez  Procedure(s) Performed: CATARACT EXTRACTION PHACO AND INTRAOCULAR LENS PLACEMENT (IOC) (Right: Eye)  Patient location during evaluation: Phase II Anesthesia Type: General Level of consciousness: awake Pain management: pain level controlled Vital Signs Assessment: post-procedure vital signs reviewed and stable Respiratory status: spontaneous breathing and respiratory function stable Cardiovascular status: blood pressure returned to baseline and stable Postop Assessment: no headache and no apparent nausea or vomiting Anesthetic complications: no Comments: Late entry   No notable events documented.   Last Vitals:  Vitals:   11/04/23 0656 11/04/23 0823  BP: (!) 174/80 138/66  Pulse: 96 85  Resp: 17 14  Temp: 36.6 C 36.7 C  SpO2: 97% 96%    Last Pain:  Vitals:   11/05/23 1041  TempSrc:   PainSc: 0-No pain                 Windell Norfolk

## 2023-11-25 ENCOUNTER — Other Ambulatory Visit: Payer: Self-pay

## 2023-11-25 ENCOUNTER — Encounter (HOSPITAL_COMMUNITY): Payer: Self-pay

## 2023-11-25 NOTE — H&P (Signed)
Surgical History & Physical  Patient Name: Ellen Martinez  DOB: 18-Sep-1951  Surgery: Cataract extraction with intraocular lens implant phacoemulsification; Left Eye Surgeon: Fabio Pierce MD Surgery Date: 11/29/2023 Pre-Op Date: 11/07/2023  HPI: A 62 Yr. old female patient 1.  The patient is returning after cataract surgery/ pre-op os. The right eye is affected. Status post cataract surgery, which began 3 days ago: Since the last visit, the affected area feels improvement. The patient's vision is improved. The condition's severity is constant. Patient is following medication instructions. The patient is still having trouble reading instructions. She is scheduled to have the os done 11/29/23. This is negatively affecting the patient's quality of life and the patient is unable to function adequately in life with the current level of vision.HPI Completed by Dr. Fabio Pierce  Medical History: Dry Eyes Cataracts  Cancer High Blood Pressure  Review of Systems Cardiovascular High Blood Pressure All recorded systems are negative except as noted above.  Social Never smoked   Medication Prednisolone-moxiflox-bromfen,  Zyrtec ,  estazolam ,  amlodipine ,  losartan-hydrochlorothiazide   Sx/Procedures Phaco c IOL OD,  Hysterectomy  Drug Allergies  NKDA  History & Physical: Heent: cataract NECK: supple without bruits LUNGS: lungs clear to auscultation CV: regular rate and rhythm Abdomen: soft and non-tender  Impression & Plan: Assessment: 1.  CATARACT EXTRACTION STATUS; Right Eye (Z98.41) 2.  COMBINED FORMS AGE RELATED CATARACT; Left Eye (H25.812)  Plan: 1.  3 days after cataract surgery. Doing well with improved vision and normal eye pressure. Call with any problems or concerns. Continue Pred-Moxi-Brom 3x/day for 4 more days and then 2x/day for 3 more weeks.  2.  Cataract accounts for the patient's decreased vision. This visual impairment is not correctable with a tolerable  change in glasses or contact lenses. Cataract surgery with an implantation of a new lens should significantly improve the visual and functional status of the patient. Discussed all risks, benefits, alternatives, and potential complications. Discussed the procedures and recovery. Patient desires to have surgery. A-scan ordered and performed today for intra-ocular lens calculations. The surgery will be performed in order to improve vision for driving, reading, and for eye examinations. Recommend phacoemulsification with intra-ocular lens. Recommend Dextenza for post-operative pain and inflammation. Left Eye. Dilates well - shugarcaine by protocol. Recommend Toric Lens.

## 2023-11-26 ENCOUNTER — Encounter (HOSPITAL_COMMUNITY)
Admission: RE | Admit: 2023-11-26 | Discharge: 2023-11-26 | Disposition: A | Discharge status: Home / Self Care | Payer: PPO | Source: Ambulatory Visit | Attending: Ophthalmology | Admitting: Ophthalmology

## 2023-12-03 DIAGNOSIS — D485 Neoplasm of uncertain behavior of skin: Secondary | ICD-10-CM | POA: Diagnosis not present

## 2023-12-03 DIAGNOSIS — C44311 Basal cell carcinoma of skin of nose: Secondary | ICD-10-CM | POA: Diagnosis not present

## 2023-12-09 DIAGNOSIS — H25812 Combined forms of age-related cataract, left eye: Secondary | ICD-10-CM | POA: Diagnosis not present

## 2023-12-10 ENCOUNTER — Encounter (HOSPITAL_COMMUNITY): Payer: Self-pay

## 2023-12-11 ENCOUNTER — Encounter (HOSPITAL_COMMUNITY)
Admission: RE | Admit: 2023-12-11 | Discharge: 2023-12-11 | Disposition: A | Discharge status: Home / Self Care | Payer: PPO | Source: Ambulatory Visit | Attending: Ophthalmology | Admitting: Ophthalmology

## 2023-12-13 ENCOUNTER — Encounter (HOSPITAL_COMMUNITY): Payer: Self-pay | Admitting: Ophthalmology

## 2023-12-13 ENCOUNTER — Encounter (HOSPITAL_COMMUNITY)
Admission: RE | Disposition: A | Discharge status: Home / Self Care | Payer: Self-pay | Source: Home / Self Care | Attending: Ophthalmology

## 2023-12-13 ENCOUNTER — Ambulatory Visit (HOSPITAL_COMMUNITY): Admitting: Anesthesiology

## 2023-12-13 ENCOUNTER — Ambulatory Visit (HOSPITAL_COMMUNITY)
Admission: RE | Admit: 2023-12-13 | Discharge: 2023-12-13 | Disposition: A | Discharge status: Home / Self Care | Payer: PPO | Attending: Ophthalmology | Admitting: Ophthalmology

## 2023-12-13 DIAGNOSIS — H2512 Age-related nuclear cataract, left eye: Secondary | ICD-10-CM | POA: Diagnosis not present

## 2023-12-13 DIAGNOSIS — H52202 Unspecified astigmatism, left eye: Secondary | ICD-10-CM | POA: Insufficient documentation

## 2023-12-13 DIAGNOSIS — Z9841 Cataract extraction status, right eye: Secondary | ICD-10-CM | POA: Diagnosis not present

## 2023-12-13 DIAGNOSIS — F419 Anxiety disorder, unspecified: Secondary | ICD-10-CM | POA: Diagnosis not present

## 2023-12-13 DIAGNOSIS — H25812 Combined forms of age-related cataract, left eye: Secondary | ICD-10-CM | POA: Insufficient documentation

## 2023-12-13 DIAGNOSIS — I10 Essential (primary) hypertension: Secondary | ICD-10-CM | POA: Diagnosis not present

## 2023-12-13 DIAGNOSIS — H259 Unspecified age-related cataract: Secondary | ICD-10-CM | POA: Diagnosis not present

## 2023-12-13 DIAGNOSIS — H5712 Ocular pain, left eye: Secondary | ICD-10-CM | POA: Diagnosis not present

## 2023-12-13 HISTORY — PX: CATARACT EXTRACTION W/PHACO: SHX586

## 2023-12-13 SURGERY — PHACOEMULSIFICATION, CATARACT, WITH IOL INSERTION
Anesthesia: Monitor Anesthesia Care | Site: Eye | Laterality: Left

## 2023-12-13 MED ORDER — MIDAZOLAM HCL 5 MG/5ML IJ SOLN
INTRAMUSCULAR | Status: DC | PRN
  Administered 2023-12-13: 2 mg via INTRAVENOUS

## 2023-12-13 MED ORDER — LIDOCAINE HCL 3.5 % OP GEL
1.0000 | Freq: Once | OPHTHALMIC | Status: AC
  Administered 2023-12-13: 1 via OPHTHALMIC

## 2023-12-13 MED ORDER — MIDAZOLAM HCL 2 MG/2ML IJ SOLN
INTRAMUSCULAR | Status: AC
  Filled 2023-12-13: qty 2

## 2023-12-13 MED ORDER — SODIUM HYALURONATE 23MG/ML IO SOSY
PREFILLED_SYRINGE | INTRAOCULAR | Status: DC | PRN
  Administered 2023-12-13: .6 mL via INTRAOCULAR

## 2023-12-13 MED ORDER — PHENYLEPHRINE-KETOROLAC 1-0.3 % IO SOLN
INTRAOCULAR | Status: DC | PRN
  Administered 2023-12-13: 500 mL via OPHTHALMIC

## 2023-12-13 MED ORDER — TROPICAMIDE 1 % OP SOLN
1.0000 [drp] | OPHTHALMIC | Status: AC | PRN
  Administered 2023-12-13 (×3): 1 [drp] via OPHTHALMIC

## 2023-12-13 MED ORDER — BSS IO SOLN
INTRAOCULAR | Status: DC | PRN
  Administered 2023-12-13: 15 mL via INTRAOCULAR

## 2023-12-13 MED ORDER — SODIUM CHLORIDE 0.9% FLUSH: 3.0000 mL | INTRAVENOUS | Status: DC | PRN

## 2023-12-13 MED ORDER — TETRACAINE HCL 0.5 % OP SOLN
1.0000 [drp] | OPHTHALMIC | Status: AC | PRN
  Administered 2023-12-13 (×3): 1 [drp] via OPHTHALMIC

## 2023-12-13 MED ORDER — DEXAMETHASONE 0.4 MG OP INST
VAGINAL_INSERT | OPHTHALMIC | Status: DC | PRN
  Administered 2023-12-13: .4 mg via OPHTHALMIC

## 2023-12-13 MED ORDER — LIDOCAINE HCL (PF) 1 % IJ SOLN
INTRAOCULAR | Status: DC | PRN
  Administered 2023-12-13: 1 mL via OPHTHALMIC

## 2023-12-13 MED ORDER — STERILE WATER FOR IRRIGATION IR SOLN
Status: DC | PRN
  Administered 2023-12-13: 250 mL

## 2023-12-13 MED ORDER — DEXAMETHASONE 0.4 MG OP INST
VAGINAL_INSERT | OPHTHALMIC | Status: AC
  Filled 2023-12-13: qty 1

## 2023-12-13 MED ORDER — PHENYLEPHRINE HCL 2.5 % OP SOLN
1.0000 [drp] | OPHTHALMIC | Status: AC | PRN
  Administered 2023-12-13 (×3): 1 [drp] via OPHTHALMIC

## 2023-12-13 MED ORDER — SODIUM CHLORIDE 0.9% FLUSH
3.0000 mL | Freq: Two times a day (BID) | INTRAVENOUS | Status: DC
Start: 2023-12-13 — End: 2023-12-13

## 2023-12-13 MED ORDER — MOXIFLOXACIN HCL 5 MG/ML IO SOLN
INTRAOCULAR | Status: DC | PRN
  Administered 2023-12-13: .3 mL via OPHTHALMIC

## 2023-12-13 MED ORDER — POVIDONE-IODINE 5 % OP SOLN
OPHTHALMIC | Status: DC | PRN
  Administered 2023-12-13: 1 via OPHTHALMIC

## 2023-12-13 MED ORDER — SODIUM CHLORIDE 0.9% FLUSH
INTRAVENOUS | Status: DC | PRN
  Administered 2023-12-13: 10 mL via INTRAVENOUS

## 2023-12-13 MED ORDER — SODIUM HYALURONATE 10 MG/ML IO SOLUTION
PREFILLED_SYRINGE | INTRAOCULAR | Status: DC | PRN
  Administered 2023-12-13: .85 mL via INTRAOCULAR

## 2023-12-13 SURGICAL SUPPLY — 14 items
CATARACT SUITE SIGHTPATH (MISCELLANEOUS) ×1 IMPLANT
CLOTH BEACON ORANGE TIMEOUT ST (SAFETY) ×1 IMPLANT
EYE SHIELD UNIVERSAL CLEAR (GAUZE/BANDAGES/DRESSINGS) IMPLANT
FEE CATARACT SUITE SIGHTPATH (MISCELLANEOUS) ×1 IMPLANT
GLOVE BIOGEL PI IND STRL 7.0 (GLOVE) ×2 IMPLANT
GOWN STRL REUS W/TWL LRG LVL3 (GOWN DISPOSABLE) IMPLANT
LENS IOL EYHANCE TRC 150 18.0 IMPLANT
LENS IOL EYHNC TORIC 150 18.0 ×1 IMPLANT
NDL HYPO 18GX1.5 BLUNT FILL (NEEDLE) ×1 IMPLANT
NEEDLE HYPO 18GX1.5 BLUNT FILL (NEEDLE) ×1 IMPLANT
PAD ARMBOARD 7.5X6 YLW CONV (MISCELLANEOUS) ×1 IMPLANT
SYR TB 1ML LL NO SAFETY (SYRINGE) ×1 IMPLANT
TAPE SURG TRANSPORE 1 IN (GAUZE/BANDAGES/DRESSINGS) IMPLANT
WATER STERILE IRR 250ML POUR (IV SOLUTION) ×1 IMPLANT

## 2023-12-13 NOTE — Interval H&P Note (Signed)
 History and Physical Interval Note:  12/13/2023 10:02 AM  Ellen Martinez  has presented today for surgery, with the diagnosis of combined forms age related cataract, left eye.  The various methods of treatment have been discussed with the patient and family. After consideration of risks, benefits and other options for treatment, the patient has consented to  Procedure(s) with comments: PHACOEMULSIFICATION, CATARACT, WITH IOL INSERTION (Left) - [address] : Dextenza added as a surgical intervention.  The patient's history has been reviewed, patient examined, no change in status, stable for surgery.  I have reviewed the patient's chart and labs.  Questions were answered to the patient's satisfaction.     Fabio Pierce

## 2023-12-13 NOTE — Op Note (Addendum)
 Date of procedure: 12/13/23  Pre-operative diagnosis: Visually significant age-related combined cataract, Left Eye; Visually Significant Astigmatism, Left Eye (H25.812)  Post-operative diagnosis: Visually significant age-related cataract, Left Eye; Visually Significant Astigmatism, Left Eye Pain and inflammation after cataract surgery, left eye  Procedure:  Removal of cataract via phacoemulsification and insertion of intra-ocular lens Laural Benes and Johnson DIU150 +18.0D into the capsular bag of the Left Eye Placement of Dextenza steroid insert, left lower eyelid  Attending surgeon: Rudy Jew. Lenis Nettleton, MD, MA  Anesthesia: MAC, Topical Akten  Complications: None  Estimated Blood Loss: <33mL (minimal)  Specimens: None  Implants: As above  Indications:  Visually significant age-related cataract, Left Eye; Visually Significant Astigmatism, Left Eye  Procedure:  The patient was seen and identified in the pre-operative area. The operative eye was identified and dilated.  The operative eye was marked.  Pre-operative toric markers were used to mark the eye at 0 and 180 degrees. Topical anesthesia was administered to the operative eye.     The patient was then to the operative suite and placed in the supine position.  A timeout was performed confirming the patient, procedure to be performed, and all other relevant information.   The patient's face was prepped and draped in the usual fashion for intra-ocular surgery.  A lid speculum was placed into the operative eye and the surgical microscope moved into place and focused.  A superotemporal paracentesis was created using a 20 gauge paracentesis blade.  Shugarcaine was injected into the anterior chamber.  Viscoelastic was injected into the anterior chamber.  A temporal clear-corneal main wound incision was created using a 2.44mm microkeratome.  A continuous curvilinear capsulorrhexis was initiated using an irrigating cystitome and completed using  capsulorrhexis forceps.  Hydrodissection and hydrodeliniation were performed.  Viscoelastic was injected into the anterior chamber.  A phacoemulsification handpiece and a chopper as a second instrument were used to remove the nucleus and epinucleus. The irrigation/aspiration handpiece was used to remove any remaining cortical material.   The capsular bag was reinflated with viscoelastic, checked, and found to be intact.  The eye was marked to the per-op meridian.  The intraocular lens was inserted into the capsular bag and dialed into place using a Kuglen hook to 95 degrees.  The irrigation/aspiration handpiece was used to remove any remaining viscoelastic.  The clear corneal wound and paracentesis wounds were then hydrated and checked with Weck-Cels to be watertight. 0.55mL of moxifloxacin was injected into the anterior chamber. The lid-speculum and drape were removed.  The lower punctum was dilated and filled with Provisc. A Dextenza implant was placed in the lower canaliculus without complication.  The patient's face was cleaned with a wet and dry 4x4.   A clear shield was taped over the eye. The patient was taken to the post-operative care unit in good condition, having tolerated the procedure well.  Post-Op Instructions: The patient will follow up at [facility] for a same day post-operative evaluation and will receive all other orders and instructions.

## 2023-12-13 NOTE — Discharge Instructions (Addendum)
 Please discharge patient when stable, will follow up today with Dr. June Leap at the [facility] office immediately following discharge.  Leave shield in place until visit.  All paperwork with discharge instructions will be given at the office.  [facility] Address:  [address]  [place], [place] [address]

## 2023-12-13 NOTE — Anesthesia Postprocedure Evaluation (Signed)
 Anesthesia Post Note  Patient: Ellen Martinez  Procedure(s) Performed: PHACOEMULSIFICATION, CATARACT, WITH IOL INSERTION with placement of Corticosteroid (Left: Eye)  Patient location during evaluation: Short Stay Anesthesia Type: MAC Level of consciousness: awake Pain management: pain level controlled Vital Signs Assessment: post-procedure vital signs reviewed and stable Respiratory status: spontaneous breathing Cardiovascular status: stable Postop Assessment: no apparent nausea or vomiting Anesthetic complications: no   No notable events documented.   Last Vitals:  Vitals:   12/13/23 0919 12/13/23 1034  BP: (!) 151/75 (!) 144/72  Pulse: 81 82  Resp: 12 14  Temp: 36.9 C 36.5 C  SpO2: 96% 99%    Last Pain:  Vitals:   12/13/23 1034  TempSrc: Oral  PainSc: 0-No pain                 Lucianne Smestad

## 2023-12-13 NOTE — Transfer of Care (Signed)
 Immediate Anesthesia Transfer of Care Note  Patient: Ellen Martinez  Procedure(s) Performed: PHACOEMULSIFICATION, CATARACT, WITH IOL INSERTION with placement of Corticosteroid (Left: Eye)  Patient Location: Short Stay  Anesthesia Type:MAC  Level of Consciousness: awake  Airway & Oxygen Therapy: Patient Spontanous Breathing  Post-op Assessment: Report given to RN  Post vital signs: Reviewed  Last Vitals:  Vitals Value Taken Time  BP    Temp    Pulse    Resp    SpO2      Last Pain:  Vitals:   12/13/23 0919  TempSrc: Oral  PainSc: 0-No pain         Complications: No notable events documented.

## 2023-12-13 NOTE — Anesthesia Preprocedure Evaluation (Signed)
 Anesthesia Evaluation  Patient identified by MRN, date of birth, ID band Patient awake    Reviewed: Allergy & Precautions, H&P , NPO status , Patient's Chart, lab work & pertinent test results, reviewed documented beta blocker date and time   Airway Mallampati: II  TM Distance: >3 FB Neck ROM: full    Dental no notable dental hx.    Pulmonary neg pulmonary ROS   Pulmonary exam normal breath sounds clear to auscultation       Cardiovascular Exercise Tolerance: Good hypertension,  Rhythm:regular Rate:Normal     Neuro/Psych   Anxiety      Neuromuscular disease  negative psych ROS   GI/Hepatic negative GI ROS, Neg liver ROS,,,  Endo/Other  negative endocrine ROS    Renal/GU negative Renal ROS  negative genitourinary   Musculoskeletal   Abdominal   Peds  Hematology negative hematology ROS (+)   Anesthesia Other Findings   Reproductive/Obstetrics negative OB ROS                             Anesthesia Physical Anesthesia Plan  ASA: 2  Anesthesia Plan:    Post-op Pain Management:    Induction:   PONV Risk Score and Plan:   Airway Management Planned:   Additional Equipment:   Intra-op Plan:   Post-operative Plan:   Informed Consent: I have reviewed the patients History and Physical, chart, labs and discussed the procedure including the risks, benefits and alternatives for the proposed anesthesia with the patient or authorized representative who has indicated his/her understanding and acceptance.     Dental Advisory Given  Plan Discussed with: CRNA  Anesthesia Plan Comments:        Anesthesia Quick Evaluation

## 2023-12-16 ENCOUNTER — Encounter (HOSPITAL_COMMUNITY): Payer: Self-pay | Admitting: Ophthalmology

## 2023-12-25 DIAGNOSIS — H35371 Puckering of macula, right eye: Secondary | ICD-10-CM | POA: Diagnosis not present

## 2023-12-25 DIAGNOSIS — H33311 Horseshoe tear of retina without detachment, right eye: Secondary | ICD-10-CM | POA: Diagnosis not present

## 2024-01-17 DIAGNOSIS — C44311 Basal cell carcinoma of skin of nose: Secondary | ICD-10-CM | POA: Diagnosis not present

## 2024-02-06 DIAGNOSIS — Z9889 Other specified postprocedural states: Secondary | ICD-10-CM | POA: Diagnosis not present

## 2024-02-06 DIAGNOSIS — H33311 Horseshoe tear of retina without detachment, right eye: Secondary | ICD-10-CM | POA: Diagnosis not present

## 2024-02-06 DIAGNOSIS — H35371 Puckering of macula, right eye: Secondary | ICD-10-CM | POA: Diagnosis not present

## 2024-02-25 DIAGNOSIS — C44311 Basal cell carcinoma of skin of nose: Secondary | ICD-10-CM | POA: Diagnosis not present

## 2024-03-10 DIAGNOSIS — R7303 Prediabetes: Secondary | ICD-10-CM | POA: Diagnosis not present

## 2024-03-10 DIAGNOSIS — I10 Essential (primary) hypertension: Secondary | ICD-10-CM | POA: Diagnosis not present

## 2024-03-10 DIAGNOSIS — Z0000 Encounter for general adult medical examination without abnormal findings: Secondary | ICD-10-CM | POA: Diagnosis not present

## 2024-03-16 DIAGNOSIS — R42 Dizziness and giddiness: Secondary | ICD-10-CM | POA: Diagnosis not present

## 2024-03-16 DIAGNOSIS — M722 Plantar fascial fibromatosis: Secondary | ICD-10-CM | POA: Diagnosis not present

## 2024-03-16 DIAGNOSIS — Z23 Encounter for immunization: Secondary | ICD-10-CM | POA: Diagnosis not present

## 2024-03-16 DIAGNOSIS — F5104 Psychophysiologic insomnia: Secondary | ICD-10-CM | POA: Diagnosis not present

## 2024-03-16 DIAGNOSIS — I10 Essential (primary) hypertension: Secondary | ICD-10-CM | POA: Diagnosis not present

## 2024-03-16 DIAGNOSIS — Z0000 Encounter for general adult medical examination without abnormal findings: Secondary | ICD-10-CM | POA: Diagnosis not present

## 2024-03-16 DIAGNOSIS — E782 Mixed hyperlipidemia: Secondary | ICD-10-CM | POA: Diagnosis not present

## 2024-03-16 DIAGNOSIS — D72829 Elevated white blood cell count, unspecified: Secondary | ICD-10-CM | POA: Diagnosis not present

## 2024-03-16 DIAGNOSIS — Z85828 Personal history of other malignant neoplasm of skin: Secondary | ICD-10-CM | POA: Diagnosis not present

## 2024-03-16 DIAGNOSIS — R944 Abnormal results of kidney function studies: Secondary | ICD-10-CM | POA: Diagnosis not present

## 2024-03-16 DIAGNOSIS — R7303 Prediabetes: Secondary | ICD-10-CM | POA: Diagnosis not present

## 2024-04-09 ENCOUNTER — Ambulatory Visit: Admitting: Orthopedic Surgery

## 2024-04-09 ENCOUNTER — Other Ambulatory Visit (INDEPENDENT_AMBULATORY_CARE_PROVIDER_SITE_OTHER): Payer: Self-pay

## 2024-04-09 VITALS — Ht 62.0 in | Wt 178.8 lb

## 2024-04-09 DIAGNOSIS — M25562 Pain in left knee: Secondary | ICD-10-CM | POA: Diagnosis not present

## 2024-04-09 DIAGNOSIS — G8929 Other chronic pain: Secondary | ICD-10-CM | POA: Diagnosis not present

## 2024-04-09 NOTE — Progress Notes (Signed)
 Chief Complaint  Patient presents with   Knee Pain    left   Assessment and plan Unexplained pain left knee X-ray negative Physical exam inconclusive  Options NSAIDs Rest Ice and/or heat Tylenol Physical therapy Cortisone injection  Differential diagnosis Threshold symptoms of osteoarthritis Torn medial meniscus  Encounter Diagnosis  Name Primary?   Acute pain of left knee Yes    Plan:  Continue use of the cane for another week with relative rest Take the Tylenol use ice if it swells or gets tight  Make an appointment for a month to recheck the knee   History this is a 73 year old female presents for evaluation of pain in her left she was having some pain in the knee about a month back.  Is having trouble straightening the knee.  With some local measures and over-the-counter medication Tylenol resolved and is normal  However a week after the beach trip she felt some pain in the front of the knee when going up and down the steps.  After that she started having pain in the back of the knee it was hard for her to straighten her knee, it was hard to walk.  She took some Tylenol and meloxicam up until 2 days ago and that pain in the front of the knee got better  Physical Exam Vitals and nursing note reviewed.  Constitutional:      Appearance: Normal appearance.  HENT:     Head: Normocephalic and atraumatic.  Eyes:     General: No scleral icterus.       Right eye: No discharge.        Left eye: No discharge.     Extraocular Movements: Extraocular movements intact.     Conjunctiva/sclera: Conjunctivae normal.     Pupils: Pupils are equal, round, and reactive to light.  Cardiovascular:     Rate and Rhythm: Normal rate.     Pulses: Normal pulses.  Musculoskeletal:     Comments: Right knee no effusion no medial joint line tenderness no pain or tenderness in the popliteal fossa normal range of motion.  The only area of pain or tenderness was over the medial femoral  condyle.  The quadriceps and patellar tendons were nontender  Skin:    General: Skin is warm and dry.     Capillary Refill: Capillary refill takes less than 2 seconds.  Neurological:     General: No focal deficit present.     Mental Status: She is alert and oriented to person, place, and time.     Gait: Gait abnormal.  Psychiatric:        Mood and Affect: Mood normal.        Behavior: Behavior normal.        Thought Content: Thought content normal.        Judgment: Judgment normal.     Past Medical History:  Diagnosis Date   Anxiety    Hypertension    Past Surgical History:  Procedure Laterality Date   ABDOMINAL HYSTERECTOMY     CATARACT EXTRACTION W/PHACO Right 11/04/2023   Procedure: CATARACT EXTRACTION PHACO AND INTRAOCULAR LENS PLACEMENT (IOC);  Surgeon: Harrie Agent, MD;  Location: [facility];  Service: Ophthalmology;  Laterality: Right;  CDE 6.10   CATARACT EXTRACTION W/PHACO Left 12/13/2023   Procedure: PHACOEMULSIFICATION, CATARACT, WITH IOL INSERTION with placement of Corticosteroid;  Surgeon: Harrie Agent, MD;  Location: [facility];  Service: Ophthalmology;  Laterality: Left;  [id number] : Dextenza  added CDE: 4.55   DG Knee  [facility] Left Result Date: 04/09/2024 X-ray report Chief complaint pain left knee 2 months Images 4 views Reading: Normal alignment No secondary bone changes Questionable joint space narrowing medially Bone quality good thick cortices are noted Impression: Mild OA

## 2024-04-09 NOTE — Progress Notes (Signed)
  Intake history:  There were no vitals taken for this visit. There is no height or weight on file to calculate BMI.    WHAT ARE WE SEEING YOU FOR TODAY?   left knee(s)  How long has this bothered you? (DOI?DOS?WS?)  2 month(s) ago  Anticoag.  No  Diabetes No  Heart disease No  Hypertension Yes  SMOKING HX No  Kidney disease No  Any ALLERGIES ______________________________________________   Treatment:  Have you taken:  Tylenol Yes  Advil No  Had PT No  Had injection No  Other  _________________________

## 2024-05-07 ENCOUNTER — Ambulatory Visit: Admitting: Orthopedic Surgery

## 2024-05-13 DIAGNOSIS — H35371 Puckering of macula, right eye: Secondary | ICD-10-CM | POA: Diagnosis not present

## 2024-05-13 DIAGNOSIS — H43812 Vitreous degeneration, left eye: Secondary | ICD-10-CM | POA: Diagnosis not present

## 2024-05-13 DIAGNOSIS — H31091 Other chorioretinal scars, right eye: Secondary | ICD-10-CM | POA: Diagnosis not present

## 2024-05-13 DIAGNOSIS — H35033 Hypertensive retinopathy, bilateral: Secondary | ICD-10-CM | POA: Diagnosis not present

## 2024-06-04 ENCOUNTER — Ambulatory Visit: Admitting: Orthopedic Surgery

## 2024-06-09 DIAGNOSIS — Z85828 Personal history of other malignant neoplasm of skin: Secondary | ICD-10-CM | POA: Diagnosis not present

## 2024-06-09 DIAGNOSIS — C44722 Squamous cell carcinoma of skin of right lower limb, including hip: Secondary | ICD-10-CM | POA: Diagnosis not present

## 2024-06-09 DIAGNOSIS — Z08 Encounter for follow-up examination after completed treatment for malignant neoplasm: Secondary | ICD-10-CM | POA: Diagnosis not present

## 2024-06-12 DIAGNOSIS — Z48817 Encounter for surgical aftercare following surgery on the skin and subcutaneous tissue: Secondary | ICD-10-CM | POA: Diagnosis not present

## 2024-07-10 ENCOUNTER — Ambulatory Visit: Admitting: Orthopedic Surgery

## 2024-08-06 ENCOUNTER — Ambulatory Visit: Admitting: Orthopedic Surgery

## 2024-09-15 DIAGNOSIS — I10 Essential (primary) hypertension: Secondary | ICD-10-CM | POA: Diagnosis not present

## 2024-09-15 DIAGNOSIS — R7303 Prediabetes: Secondary | ICD-10-CM | POA: Diagnosis not present

## 2024-09-15 DIAGNOSIS — M722 Plantar fascial fibromatosis: Secondary | ICD-10-CM | POA: Diagnosis not present

## 2024-09-15 DIAGNOSIS — E669 Obesity, unspecified: Secondary | ICD-10-CM | POA: Diagnosis not present

## 2024-09-15 DIAGNOSIS — Z23 Encounter for immunization: Secondary | ICD-10-CM | POA: Diagnosis not present

## 2024-09-15 DIAGNOSIS — F5104 Psychophysiologic insomnia: Secondary | ICD-10-CM | POA: Diagnosis not present

## 2024-09-15 DIAGNOSIS — R42 Dizziness and giddiness: Secondary | ICD-10-CM | POA: Diagnosis not present

## 2024-09-15 DIAGNOSIS — Z1211 Encounter for screening for malignant neoplasm of colon: Secondary | ICD-10-CM | POA: Diagnosis not present

## 2024-09-15 DIAGNOSIS — D72829 Elevated white blood cell count, unspecified: Secondary | ICD-10-CM | POA: Diagnosis not present

## 2024-09-15 DIAGNOSIS — R944 Abnormal results of kidney function studies: Secondary | ICD-10-CM | POA: Diagnosis not present

## 2024-09-15 DIAGNOSIS — G479 Sleep disorder, unspecified: Secondary | ICD-10-CM | POA: Diagnosis not present

## 2024-09-15 DIAGNOSIS — E782 Mixed hyperlipidemia: Secondary | ICD-10-CM | POA: Diagnosis not present

## 2024-09-17 ENCOUNTER — Encounter (INDEPENDENT_AMBULATORY_CARE_PROVIDER_SITE_OTHER): Payer: Self-pay
